# Patient Record
Sex: Male | Born: 1960 | Race: White | Hispanic: No | Marital: Married | Smoking: Never smoker
Health system: Southern US, Community
[De-identification: ages and names within clinical notes are randomized; demographics above are authoritative.]

## PROBLEM LIST (undated history)

## (undated) DIAGNOSIS — K760 Fatty (change of) liver, not elsewhere classified: Secondary | ICD-10-CM

## (undated) DIAGNOSIS — E785 Hyperlipidemia, unspecified: Secondary | ICD-10-CM

## (undated) DIAGNOSIS — J302 Other seasonal allergic rhinitis: Secondary | ICD-10-CM

## (undated) DIAGNOSIS — E291 Testicular hypofunction: Secondary | ICD-10-CM

## (undated) DIAGNOSIS — M069 Rheumatoid arthritis, unspecified: Secondary | ICD-10-CM

## (undated) DIAGNOSIS — L4050 Arthropathic psoriasis, unspecified: Secondary | ICD-10-CM

## (undated) DIAGNOSIS — E669 Obesity, unspecified: Secondary | ICD-10-CM

## (undated) DIAGNOSIS — I10 Essential (primary) hypertension: Secondary | ICD-10-CM

## (undated) DIAGNOSIS — J4530 Mild persistent asthma, uncomplicated: Secondary | ICD-10-CM

## (undated) DIAGNOSIS — K5790 Diverticulosis of intestine, part unspecified, without perforation or abscess without bleeding: Secondary | ICD-10-CM

## (undated) DIAGNOSIS — E119 Type 2 diabetes mellitus without complications: Secondary | ICD-10-CM

## (undated) DIAGNOSIS — J45909 Unspecified asthma, uncomplicated: Secondary | ICD-10-CM

## (undated) HISTORY — DX: Rheumatoid arthritis, unspecified: M06.9

## (undated) HISTORY — DX: Testicular hypofunction: E29.1

## (undated) HISTORY — DX: Diverticulosis of intestine, part unspecified, without perforation or abscess without bleeding: K57.90

## (undated) HISTORY — DX: Fatty (change of) liver, not elsewhere classified: K76.0

## (undated) HISTORY — DX: Hyperlipidemia, unspecified: E78.5

## (undated) HISTORY — DX: Essential (primary) hypertension: I10

## (undated) HISTORY — DX: Obesity, unspecified: E66.9

## (undated) HISTORY — DX: Mild persistent asthma, uncomplicated: J45.30

## (undated) HISTORY — PX: WISDOM TOOTH EXTRACTION: SHX21

## (undated) HISTORY — PX: VASECTOMY: SHX75

---

## 2008-08-09 ENCOUNTER — Encounter: Admission: RE | Admit: 2008-08-09 | Discharge: 2008-08-09 | Payer: Self-pay | Admitting: Otolaryngology

## 2008-09-08 ENCOUNTER — Encounter (INDEPENDENT_AMBULATORY_CARE_PROVIDER_SITE_OTHER): Payer: Self-pay | Admitting: Interventional Radiology

## 2008-09-08 ENCOUNTER — Other Ambulatory Visit: Admission: RE | Admit: 2008-09-08 | Discharge: 2008-09-08 | Payer: Self-pay | Admitting: Interventional Radiology

## 2008-09-08 ENCOUNTER — Encounter: Admission: RE | Admit: 2008-09-08 | Discharge: 2008-09-08 | Payer: Self-pay | Admitting: Otolaryngology

## 2012-05-23 ENCOUNTER — Other Ambulatory Visit (HOSPITAL_COMMUNITY): Payer: Self-pay | Admitting: Family Medicine

## 2012-05-23 ENCOUNTER — Ambulatory Visit (HOSPITAL_COMMUNITY)
Admission: RE | Admit: 2012-05-23 | Discharge: 2012-05-23 | Disposition: A | Discharge status: Home / Self Care | Payer: BC Managed Care – PPO | Source: Ambulatory Visit | Attending: Family Medicine | Admitting: Family Medicine

## 2012-05-23 DIAGNOSIS — M2550 Pain in unspecified joint: Secondary | ICD-10-CM

## 2012-05-23 DIAGNOSIS — M79609 Pain in unspecified limb: Secondary | ICD-10-CM | POA: Insufficient documentation

## 2014-01-26 ENCOUNTER — Other Ambulatory Visit (HOSPITAL_COMMUNITY): Payer: Self-pay | Admitting: Family Medicine

## 2014-01-26 DIAGNOSIS — R7989 Other specified abnormal findings of blood chemistry: Secondary | ICD-10-CM

## 2014-01-26 DIAGNOSIS — K769 Liver disease, unspecified: Secondary | ICD-10-CM

## 2014-01-26 DIAGNOSIS — R945 Abnormal results of liver function studies: Secondary | ICD-10-CM

## 2014-01-27 NOTE — H&P (Signed)
  NTS SOAP Note  Vital Signs:  Vitals as of: 01/27/2014: Systolic 147: Diastolic 88: Heart Rate 74: Temp 75F: Height 25ft 7in: Weight 195Lbs 0 Ounces: BMI 30.54  BMI : 30.54 kg/m2  Subjective: This 82 Years 59 Months old Male presents for screening TCS.  Never has had a colonoscopy.  No family h/o colon cancer.  Denies any gi complaints.  Review of Symptoms:  Constitutional:unremarkable   Head:unremarkable    Eyes:unremarkable   sinus problems Cardiovascular:  unremarkable   Respiratory:unremarkable   Gastrointestinal:  unremarkable   Genitourinary:unremarkable     Musculoskeletal:unremarkable   Skin:unremarkable Hematolgic/Lymphatic:unremarkable     Allergic/Immunologic:unremarkable     Past Medical History:    Reviewed  Past Medical History  Surgical History: vasectomy Medical Problems: copd Allergies: nkda Medications: folic acid, fenofibrate, advair, methotrexate, humara   Social History:Reviewed  Social History  Preferred Language: English Race:  White Ethnicity: Not Hispanic / Latino Age: 74 Years 5 Months Marital Status:  M Alcohol: no   Smoking Status: Never smoker reviewed on 01/21/2014 Functional Status reviewed on 01/21/2014 ------------------------------------------------ Bathing: Normal Cooking: Normal Dressing: Normal Driving: Normal Eating: Normal Managing Meds: Normal Oral Care: Normal Shopping: Normal Toileting: Normal Transferring: Normal Walking: Normal Cognitive Status reviewed on 01/21/2014 ------------------------------------------------ Attention: Normal Decision Making: Normal Language: Normal Memory: Normal Motor: Normal Perception: Normal Problem Solving: Normal Visual and Spatial: Normal   Family History:  Reviewed  Family Health History Mother, Living; Healthy; healthy Father, Deceased; Skin cancer;     Objective Information: General:  Well appearing, well  nourished in no distress. Heart:  RRR, no murmur Lungs:    CTA bilaterally, no wheezes, rhonchi, rales.  Breathing unlabored. Abdomen:Soft, NT/ND, no HSM, no masses.   deferred to procedure  Assessment:Need for screening TCS  Diagnoses: V76.51 Screening for malignant neoplasm of colon (Encounter for screening for malignant neoplasm of colon)  Procedures: 14239 - OFFICE OUTPATIENT NEW 20 MINUTES    Plan:  Scheduled for TCS on 02/23/14.   Patient Education:Alternative treatments to surgery were discussed with patient (and family).  Risks and benefits  of procedure were fully explained to the patient (and family) who gave informed consent. Patient/family questions were addressed.  Follow-up:Pending Surgery

## 2014-01-28 ENCOUNTER — Ambulatory Visit (HOSPITAL_COMMUNITY)
Admission: RE | Admit: 2014-01-28 | Discharge: 2014-01-28 | Disposition: A | Discharge status: Home / Self Care | Payer: BC Managed Care – PPO | Source: Ambulatory Visit | Attending: Family Medicine | Admitting: Family Medicine

## 2014-01-28 DIAGNOSIS — K7689 Other specified diseases of liver: Secondary | ICD-10-CM | POA: Insufficient documentation

## 2014-01-28 DIAGNOSIS — R945 Abnormal results of liver function studies: Secondary | ICD-10-CM

## 2014-01-28 DIAGNOSIS — R7989 Other specified abnormal findings of blood chemistry: Secondary | ICD-10-CM

## 2014-01-28 DIAGNOSIS — K769 Liver disease, unspecified: Secondary | ICD-10-CM

## 2014-02-16 ENCOUNTER — Encounter (HOSPITAL_COMMUNITY): Payer: Self-pay | Admitting: Pharmacy Technician

## 2014-02-23 ENCOUNTER — Ambulatory Visit (HOSPITAL_COMMUNITY)
Admission: RE | Admit: 2014-02-23 | Discharge: 2014-02-23 | Disposition: A | Discharge status: Home / Self Care | Payer: BC Managed Care – PPO | Source: Ambulatory Visit | Attending: General Surgery | Admitting: General Surgery

## 2014-02-23 ENCOUNTER — Encounter (HOSPITAL_COMMUNITY)
Admission: RE | Disposition: A | Discharge status: Home / Self Care | Payer: Self-pay | Source: Ambulatory Visit | Attending: General Surgery

## 2014-02-23 ENCOUNTER — Encounter (HOSPITAL_COMMUNITY): Payer: Self-pay | Admitting: *Deleted

## 2014-02-23 DIAGNOSIS — J449 Chronic obstructive pulmonary disease, unspecified: Secondary | ICD-10-CM | POA: Insufficient documentation

## 2014-02-23 DIAGNOSIS — Z1211 Encounter for screening for malignant neoplasm of colon: Secondary | ICD-10-CM | POA: Insufficient documentation

## 2014-02-23 DIAGNOSIS — K5730 Diverticulosis of large intestine without perforation or abscess without bleeding: Secondary | ICD-10-CM | POA: Insufficient documentation

## 2014-02-23 DIAGNOSIS — J4489 Other specified chronic obstructive pulmonary disease: Secondary | ICD-10-CM | POA: Insufficient documentation

## 2014-02-23 DIAGNOSIS — Z79899 Other long term (current) drug therapy: Secondary | ICD-10-CM | POA: Insufficient documentation

## 2014-02-23 HISTORY — PX: COLONOSCOPY: SHX5424

## 2014-02-23 HISTORY — DX: Other seasonal allergic rhinitis: J30.2

## 2014-02-23 HISTORY — DX: Unspecified asthma, uncomplicated: J45.909

## 2014-02-23 HISTORY — DX: Arthropathic psoriasis, unspecified: L40.50

## 2014-02-23 SURGERY — COLONOSCOPY
Anesthesia: Moderate Sedation

## 2014-02-23 MED ORDER — MIDAZOLAM HCL 5 MG/5ML IJ SOLN
INTRAMUSCULAR | Status: DC | PRN
  Administered 2014-02-23: 2 mg via INTRAVENOUS

## 2014-02-23 MED ORDER — SODIUM CHLORIDE 0.9 % IV SOLN
INTRAVENOUS | Status: DC
  Administered 2014-02-23: 08:00:00 via INTRAVENOUS

## 2014-02-23 MED ORDER — MIDAZOLAM HCL 5 MG/5ML IJ SOLN
INTRAMUSCULAR | Status: AC
  Filled 2014-02-23: qty 10

## 2014-02-23 MED ORDER — MEPERIDINE HCL 50 MG/ML IJ SOLN
INTRAMUSCULAR | Status: DC | PRN
  Administered 2014-02-23: 50 mg via INTRAVENOUS

## 2014-02-23 MED ORDER — MEPERIDINE HCL 100 MG/ML IJ SOLN
INTRAMUSCULAR | Status: AC
  Filled 2014-02-23: qty 2

## 2014-02-23 NOTE — Interval H&P Note (Signed)
History and Physical Interval Note:  02/23/2014 8:21 AM  Brandon Pennington  has presented today for surgery, with the diagnosis of screening  The various methods of treatment have been discussed with the patient and family. After consideration of risks, benefits and other options for treatment, the patient has consented to  Procedure(s): COLONOSCOPY (N/Pennington) as Pennington surgical intervention .  The patient's history has been reviewed, patient examined, no change in status, stable for surgery.  I have reviewed the patient's chart and labs.  Questions were answered to the patient's satisfaction.     Franky MachoJENKINS,Brandon Pennington

## 2014-02-23 NOTE — Op Note (Signed)
[facility] [address] [place], [id number]   COLONOSCOPY PROCEDURE REPORT  PATIENT: Brandon Pennington, Brandon L.  MR#: [id number] BIRTHDATE: 01/24/1961 , 52  yrs. old GENDER: Male ENDOSCOPIST: Franky MachoMark Feven Alderfer, MD REFERRED GE:XBMWUXLBY:Golding, John PROCEDURE DATE:  02/23/2014 PROCEDURE:   Colonoscopy, screening ASA CLASS:   Class II INDICATIONS:Average risk patient for colon cancer. MEDICATIONS: Versed 3 mg IV and Demerol 50 mg IV  DESCRIPTION OF PROCEDURE:   After the risks benefits and alternatives of the procedure were thoroughly explained, informed consent was obtained.  A digital rectal exam revealed no abnormalities of the rectum.   The [id number] ([id number]  endoscope was introduced through the anus and advanced to the cecum, which was identified by both the appendix and ileocecal valve. No adverse events experienced.   The quality of the prep was adequate, using Trilyte  The instrument was then slowly withdrawn as the colon was fully examined.      COLON FINDINGS: Mild diverticulosis was noted in the sigmoid colon. The colon mucosa was otherwise normal.  Retroflexed views revealed no abnormalities. The time to cecum=3 minutes 0 seconds. Withdrawal time=4 minutes 0 seconds.  The scope was withdrawn and the procedure completed. COMPLICATIONS: There were no complications.  ENDOSCOPIC IMPRESSION: 1.   Mild diverticulosis was noted in the sigmoid colon 2.   The colon mucosa was otherwise normal  RECOMMENDATIONS: Repeat Colonscopy in 10 years.   eSigned:  Franky MachoMark Landis Cassaro, MD 02/23/2014 8:45 AM   cc:

## 2014-02-23 NOTE — Discharge Instructions (Signed)

## 2014-02-24 ENCOUNTER — Encounter (HOSPITAL_COMMUNITY): Payer: Self-pay | Admitting: General Surgery

## 2014-12-04 ENCOUNTER — Emergency Department (HOSPITAL_COMMUNITY)
Admission: EM | Admit: 2014-12-04 | Discharge: 2014-12-05 | Disposition: A | Discharge status: Home / Self Care | Payer: BLUE CROSS/BLUE SHIELD | Attending: Emergency Medicine | Admitting: Emergency Medicine

## 2014-12-04 ENCOUNTER — Encounter (HOSPITAL_COMMUNITY): Payer: Self-pay | Admitting: Emergency Medicine

## 2014-12-04 DIAGNOSIS — Z7951 Long term (current) use of inhaled steroids: Secondary | ICD-10-CM | POA: Diagnosis not present

## 2014-12-04 DIAGNOSIS — K625 Hemorrhage of anus and rectum: Secondary | ICD-10-CM | POA: Insufficient documentation

## 2014-12-04 DIAGNOSIS — K5791 Diverticulosis of intestine, part unspecified, without perforation or abscess with bleeding: Secondary | ICD-10-CM

## 2014-12-04 DIAGNOSIS — K5790 Diverticulosis of intestine, part unspecified, without perforation or abscess without bleeding: Secondary | ICD-10-CM | POA: Diagnosis not present

## 2014-12-04 DIAGNOSIS — Z8739 Personal history of other diseases of the musculoskeletal system and connective tissue: Secondary | ICD-10-CM | POA: Insufficient documentation

## 2014-12-04 DIAGNOSIS — Z79899 Other long term (current) drug therapy: Secondary | ICD-10-CM | POA: Insufficient documentation

## 2014-12-04 DIAGNOSIS — R197 Diarrhea, unspecified: Secondary | ICD-10-CM | POA: Insufficient documentation

## 2014-12-04 DIAGNOSIS — J45909 Unspecified asthma, uncomplicated: Secondary | ICD-10-CM | POA: Insufficient documentation

## 2014-12-04 DIAGNOSIS — K921 Melena: Secondary | ICD-10-CM | POA: Diagnosis present

## 2014-12-04 LAB — CBC WITH DIFFERENTIAL/PLATELET
BASOS PCT: 1 % (ref 0–1)
Basophils Absolute: 0.1 10*3/uL (ref 0.0–0.1)
EOS ABS: 0.1 10*3/uL (ref 0.0–0.7)
Eosinophils Relative: 1 % (ref 0–5)
HEMATOCRIT: 45.3 % (ref 39.0–52.0)
HEMOGLOBIN: 15.3 g/dL (ref 13.0–17.0)
LYMPHS ABS: 1 10*3/uL (ref 0.7–4.0)
LYMPHS PCT: 12 % (ref 12–46)
MCH: 31 pg (ref 26.0–34.0)
MCHC: 33.8 g/dL (ref 30.0–36.0)
MCV: 91.7 fL (ref 78.0–100.0)
MONOS PCT: 17 % — AB (ref 3–12)
Monocytes Absolute: 1.5 10*3/uL — ABNORMAL HIGH (ref 0.1–1.0)
NEUTROS PCT: 69 % (ref 43–77)
Neutro Abs: 6.1 10*3/uL (ref 1.7–7.7)
PLATELETS: 291 10*3/uL (ref 150–400)
RBC: 4.94 MIL/uL (ref 4.22–5.81)
RDW: 13.9 % (ref 11.5–15.5)
WBC: 8.8 10*3/uL (ref 4.0–10.5)

## 2014-12-04 MED ORDER — SODIUM CHLORIDE 0.9 % IV BOLUS (SEPSIS)
1000.0000 mL | Freq: Once | INTRAVENOUS | Status: AC
Start: 2014-12-04 — End: 2014-12-05
  Administered 2014-12-04: 1000 mL via INTRAVENOUS

## 2014-12-04 NOTE — ED Provider Notes (Signed)
CSN: [id number]     Arrival date & time 12/04/14  2210 History  This chart was scribed for Zadie Rhineonald Randalyn Ahmed, MD by Jarvis Morganaylor Ferguson, ED Scribe. This patient was seen in room APA08/APA08 and the patient's care was started at 11:11 PM.    Chief Complaint  Patient presents with  . Blood In Stools    Patient is a 54 y.o. male presenting with GI illness. The history is provided by the patient. No language interpreter was used.  GI Problem This is a new problem. The current episode started 6 to 12 hours ago. The problem occurs rarely. The problem has been gradually worsening. Pertinent negatives include no chest pain, no abdominal pain, no headaches and no shortness of breath. Exacerbated by: bowel movements. Nothing relieves the symptoms. He has tried nothing for the symptoms.    HPI Comments: Brandon Pennington is a 54 y.o. male who presents to the Emergency Department complaining of episodic, gradually worsening blood in stools that began around 10 hours ago. Pt states he has had 6-7 episodes of bright red blood and diarrhea. He is complaining of associated subjective fever. He states he started a z-pak yesterday. Pt is not any anticoagulants. He denies h/o peptic ulcers. He dnies h/o HTN or DM. Pt has had a colonoscopy in the past that was normal. He denies melena, emesis, weakness, dizziness, chest pain, SOB, abdominal pain.    Past Medical History  Diagnosis Date  . Psoriatic arthritis   . Seasonal allergies   . Asthma    Past Surgical History  Procedure Laterality Date  . Wisdom tooth extraction    . Vasectomy    . Colonoscopy N/A 02/23/2014    Procedure: COLONOSCOPY;  Surgeon: Dalia HeadingMark A Jenkins, MD;  Location: [facility];  Service: Gastroenterology;  Laterality: N/A;   Family History  Problem Relation Age of Onset  . Melanoma Father   . Colon cancer Neg Hx    History  Substance Use Topics  . Smoking status: Never Smoker   . Smokeless tobacco: Not on file  . Alcohol Use: No     Review of Systems  Constitutional: Positive for fever (subjective).  Respiratory: Negative for shortness of breath.   Cardiovascular: Negative for chest pain.  Gastrointestinal: Positive for diarrhea and blood in stool. Negative for vomiting and abdominal pain.  Neurological: Negative for dizziness, weakness and headaches.  All other systems reviewed and are negative.     Allergies  Review of patient's allergies indicates no known allergies.  Home Medications   Prior to Admission medications   Medication Sig Start Date End Date Taking? Authorizing Provider  Adalimumab (HUMIRA) 40 MG/0.8ML PSKT Inject 40 mg into the skin every 14 (fourteen) days.    Historical Provider, MD  fenofibrate 160 MG tablet Take 160 mg by mouth daily.    Historical Provider, MD  Fluticasone-Salmeterol (ADVAIR) 250-50 MCG/DOSE AEPB Inhale 1 puff into the lungs 2 (two) times daily.    Historical Provider, MD  folic acid (FOLVITE) 1 MG tablet Take 1 mg by mouth daily.    Historical Provider, MD  methotrexate (50 MG/ML) 1 G injection Inject 25 mg into the vein once a week.    Historical Provider, MD  montelukast (SINGULAIR) 10 MG tablet Take 10 mg by mouth daily.    Historical Provider, MD  Multiple Vitamin (MULTIVITAMIN WITH MINERALS) TABS tablet Take 1 tablet by mouth daily.    Historical Provider, MD  testosterone cypionate (DEPOTESTOTERONE CYPIONATE) 200 MG/ML injection Inject 200  mg into the muscle every 28 (twenty-eight) days.    Historical Provider, MD   Triage Vitals: BP 127/83 mmHg  Pulse 120  Temp(Src) 99.6 F (37.6 C) (Oral)  Resp 20  Ht  (1.702 m)  Wt 195 lb (88.451 kg)  BMI 30.53 kg/m2  SpO2 98%   Physical Exam  Nursing note and vitals reviewed.  CONSTITUTIONAL: Well developed/well nourished HEAD: Normocephalic/atraumatic EYES: EOMI/PERRL ENMT: Mucous membranes moist NECK: supple no meningeal signs SPINE/BACK:entire spine nontender CV: S1/S2 noted, no murmurs/rubs/gallops  noted LUNGS: Lungs are clear to auscultation bilaterally, no apparent distress ABDOMEN: soft, nontender, no rebound or guarding, bowel sounds noted throughout abdomen GU:no cva tenderness. RECTAL: no hemorrhoids noted. Nurse chaperone present. Large amount of bloody stool noted in toilet NEURO: Pt is awake/alert/appropriate, moves all extremitiesx4.  No facial droop.   EXTREMITIES: pulses normal/equal, full ROM SKIN: warm, color normal PSYCH: no abnormalities of mood noted, alert and oriented to situation   ED Course  Procedures   DIAGNOSTIC STUDIES: Oxygen Saturation is 98% on RA, normal by my interpretation.    COORDINATION OF CARE: 11:17 PM- Will order NaCl bolus and diagnostic lab workup.  Pt advised of plan for treatment and pt agrees. 12:23 AM Per chart, patient had coloscopy in 2015 that showed diverticulosis Pt stable He is in no pain at this time 1:10 AM Pt improved He would prefer not to be admitted Suspect he has painless bleeding from diverticulosis He still would like to go home Plan is to monitor in ER, recheck CBC at 230am Vitals appropriate at this time 3:08 AM Pt improved No abd pain Vitals improved He did have some diarrhea here but improving He prefers to go home With painless rectal bleeding and known diverticulosis, suspect this is cause He understands this could get worse Reassured by minimal change in HGB while monitored in ED We discussed strict return precautions including abd pain, worsened bleeding/weakness/dizziness He will stop zpack Will defer further antibiotics for now   Labs Review Labs Reviewed  BASIC METABOLIC PANEL - Abnormal; Notable for the following:    Glucose, Bld 178 (*)    GFR calc non Af Amer 70 (*)    GFR calc Af Amer 81 (*)    All other components within normal limits  CBC WITH DIFFERENTIAL/PLATELET - Abnormal; Notable for the following:    Monocytes Relative 17 (*)    Monocytes Absolute 1.5 (*)    All other  components within normal limits  CBC  TYPE AND SCREEN      MDM   Final diagnoses:  Rectal bleeding  Diverticulosis of intestine with bleeding, unspecified intestinal tract location    Nursing notes including past medical history and social history reviewed and considered in documentation Labs/vital reviewed myself and considered during evaluation   I personally performed the services described in this documentation, which was scribed in my presence. The recorded information has been reviewed and is accurate.      Zadie Rhine, MD 12/05/14 [phone]

## 2014-12-04 NOTE — ED Notes (Signed)
Patient reports started taking Z-pack yesterday. Reports noticed bright red blood in his stool earlier today. States since has worsened and is now having bloody diarrhea. Denies pain or vomiting. Complains of slight nausea.

## 2014-12-05 LAB — CBC
HCT: 42.5 % (ref 39.0–52.0)
Hemoglobin: 14.7 g/dL (ref 13.0–17.0)
MCH: 31.6 pg (ref 26.0–34.0)
MCHC: 34.6 g/dL (ref 30.0–36.0)
MCV: 91.4 fL (ref 78.0–100.0)
Platelets: 277 10*3/uL (ref 150–400)
RBC: 4.65 MIL/uL (ref 4.22–5.81)
RDW: 13.9 % (ref 11.5–15.5)
WBC: 8.7 10*3/uL (ref 4.0–10.5)

## 2014-12-05 LAB — TYPE AND SCREEN
ABO/RH(D): O POS
Antibody Screen: NEGATIVE

## 2014-12-05 LAB — BASIC METABOLIC PANEL
Anion gap: 8 (ref 5–15)
BUN: 17 mg/dL (ref 6–23)
CALCIUM: 8.8 mg/dL (ref 8.4–10.5)
CHLORIDE: 103 mmol/L (ref 96–112)
CO2: 26 mmol/L (ref 19–32)
Creatinine, Ser: 1.17 mg/dL (ref 0.50–1.35)
GFR calc non Af Amer: 70 mL/min — ABNORMAL LOW (ref >90)
GFR, EST AFRICAN AMERICAN: 81 mL/min — AB (ref >90)
GLUCOSE: 178 mg/dL — AB (ref 70–99)
POTASSIUM: 3.9 mmol/L (ref 3.5–5.1)
Sodium: 137 mmol/L (ref 135–145)

## 2014-12-05 NOTE — Discharge Instructions (Signed)
Bloody Stools °Bloody stools means there is blood in your poop (stool). It is a sign that there is a problem somewhere in the digestive system. It is important for your doctor to find the cause of your bleeding, so the problem can be treated.  °HOME CARE °· Only take medicine as told by your doctor. °· Eat foods with fiber (prunes, bran cereals). °· Drink enough fluids to keep your pee (urine) clear or pale yellow. °· Sit in warm water (sitz bath) for 10 to 15 minutes as told by your doctor. °· Know how to take your medicines (enemas, suppositories) if advised by your doctor. °· Watch for signs that you are getting better or getting worse. °GET HELP RIGHT AWAY IF:  °· You are not getting better. °· You start to get better but then get worse again. °· You have new problems. °· You have severe bleeding from the place where poop comes out (rectum) that does not stop. °· You throw up (vomit) blood. °· You feel weak or pass out (faint). °· You have a fever. °MAKE SURE YOU:  °· Understand these instructions. °· Will watch your condition. °· Will get help right away if you are not doing well or get worse. °Document Released: 07/25/2009 Document Revised: 10/29/2011 Document Reviewed: 12/22/2010 °ExitCare® Patient Information ©2015 [employer]. This information is not intended to replace advice given to you by your health care provider. Make sure you discuss any questions you have with your health care provider. ° °

## 2015-12-06 IMAGING — US US ABDOMEN COMPLETE
1 series · 14 of 25 positions shown · non-contrast
Comparison: None.

CLINICAL DATA: Elevated LFTs.  No pain, nausea, vomiting.

EXAM:
ULTRASOUND ABDOMEN COMPLETE

[Series 1: us abdomen complete · 0.18mm/px · 14 of 150 slices shown]
[im 1/150]
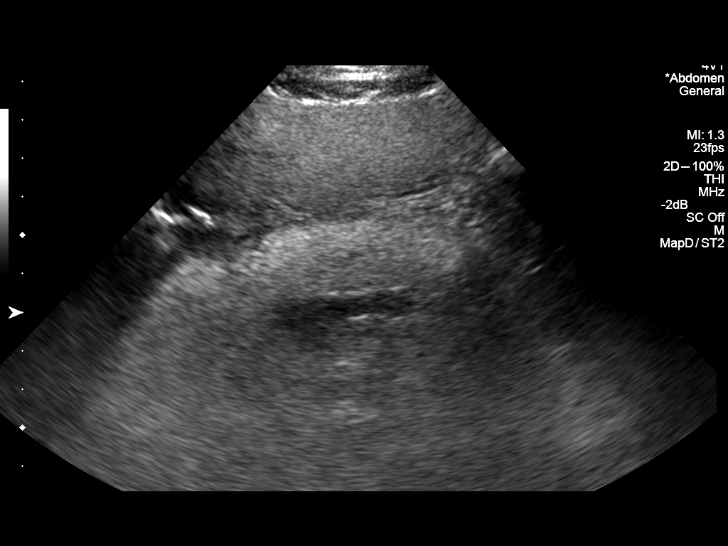
[im 13/150]
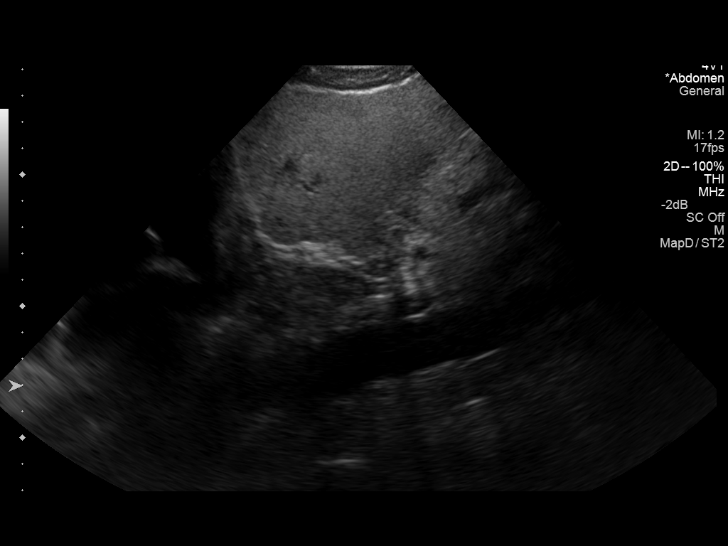
[im 25/150]
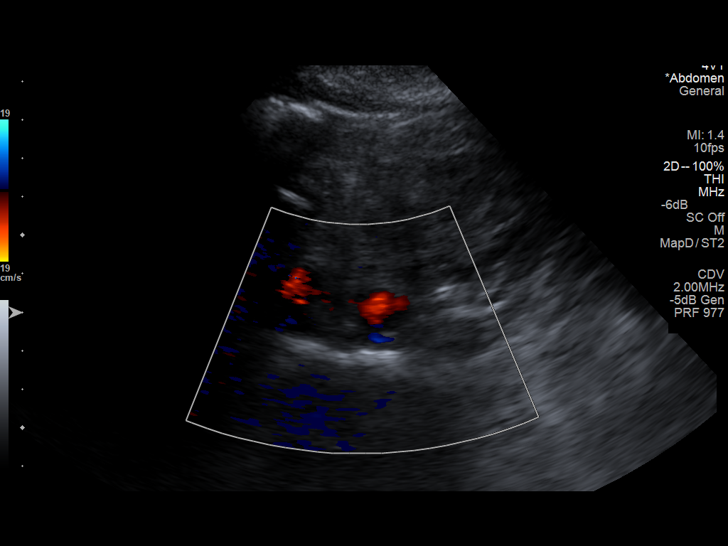
[im 38/150]
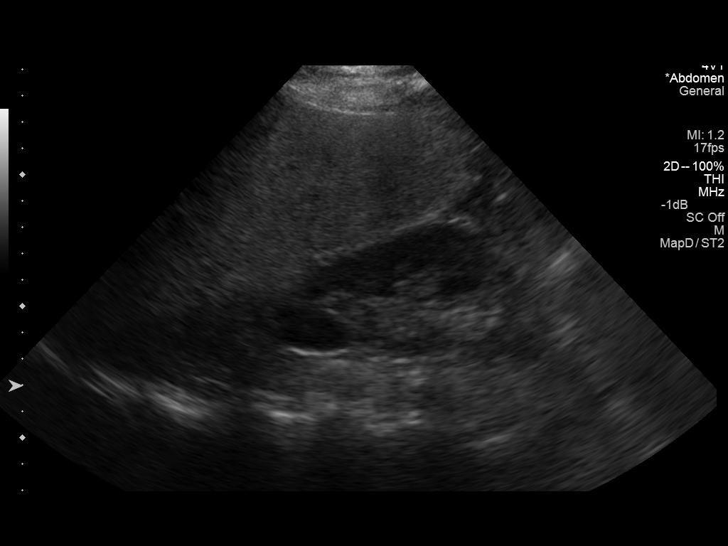
[im 50/150]
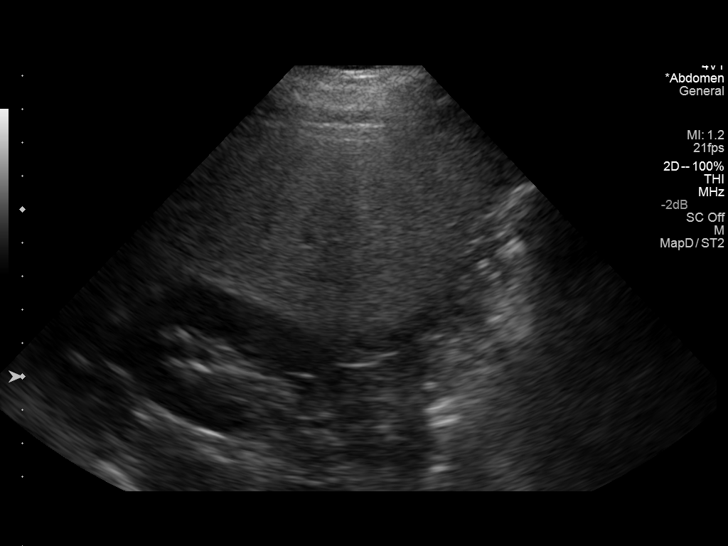
[im 56/150]
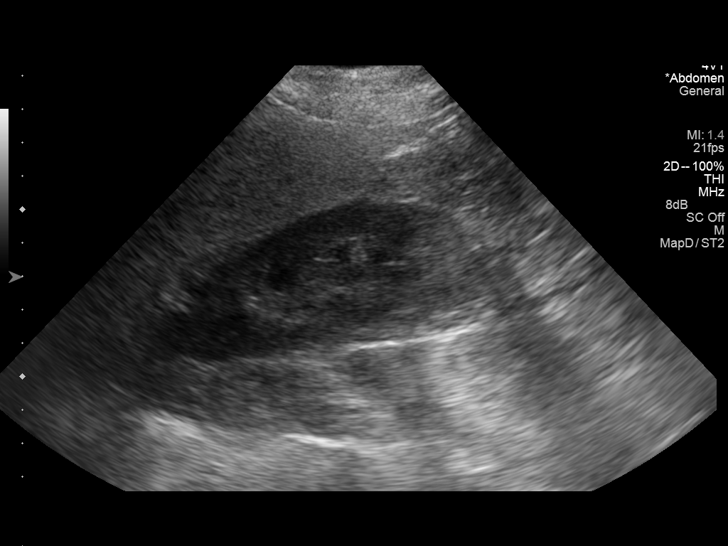
[im 69/150]
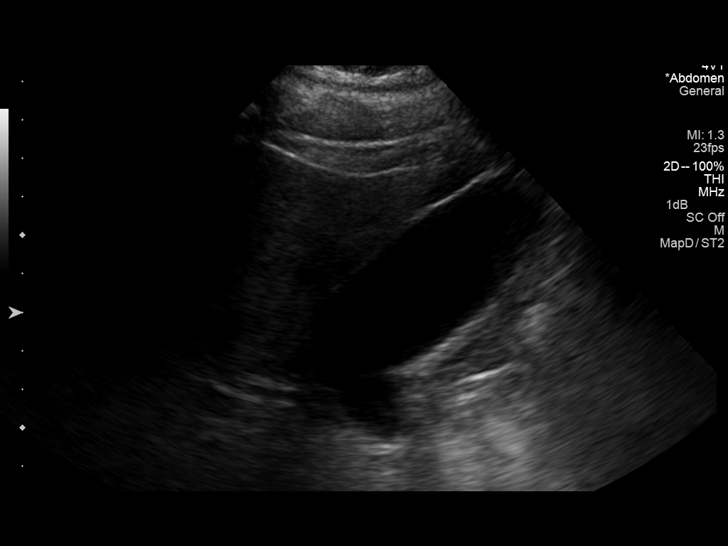
[im 81/150]
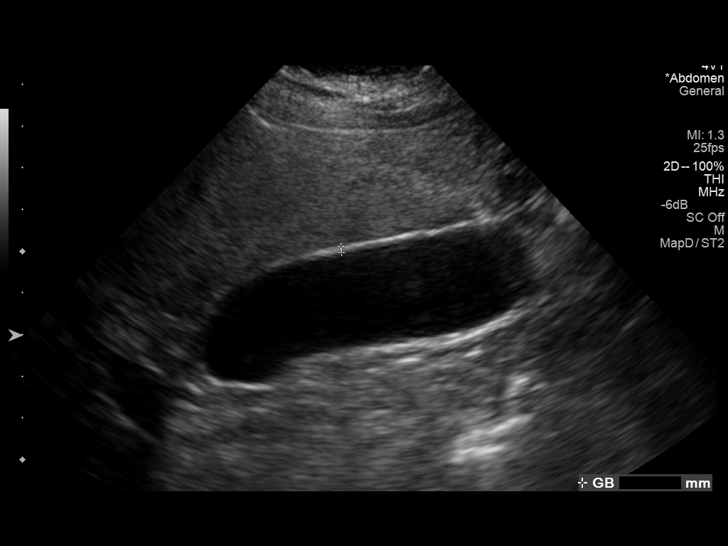
[im 94/150]
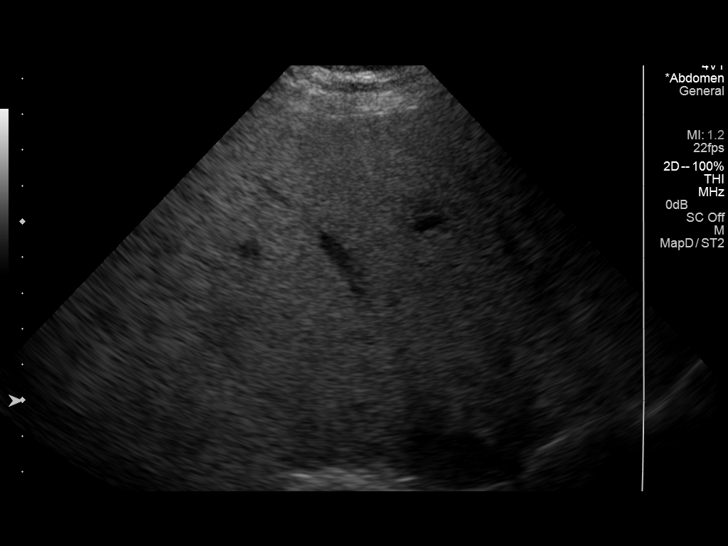
[im 100/150]
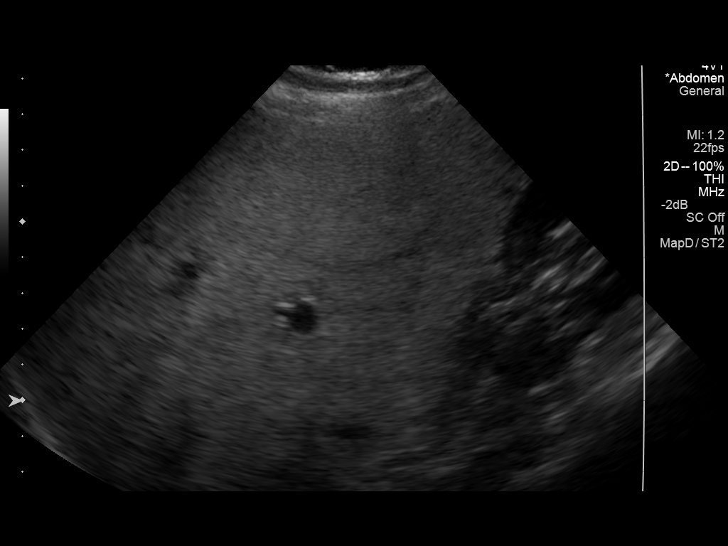
[im 112/150]
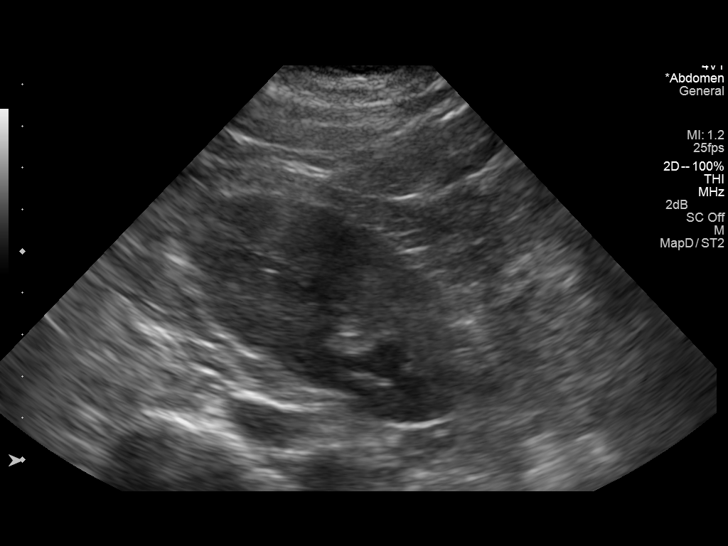
[im 125/150]
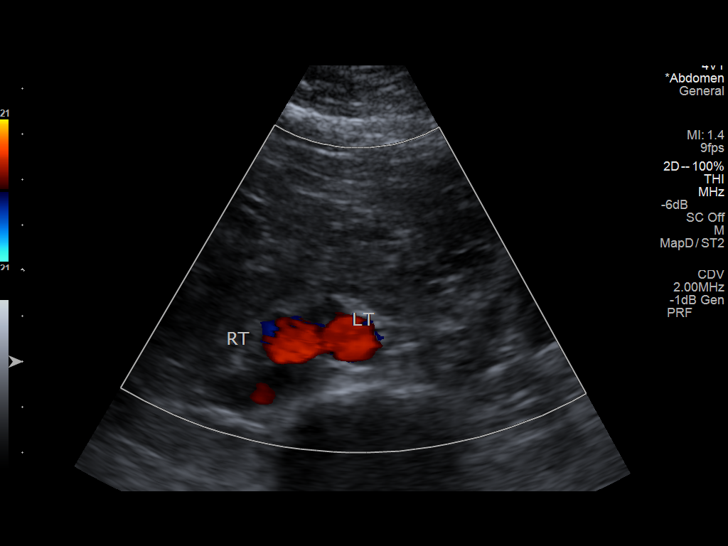
[im 137/150]
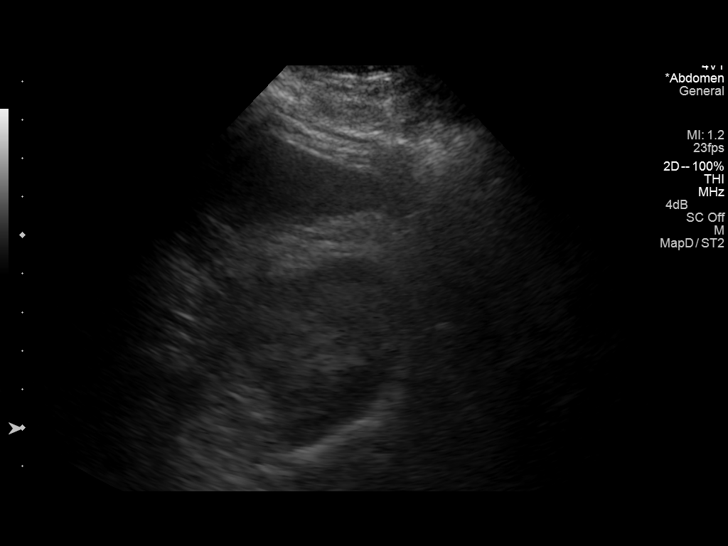
[im 150/150]
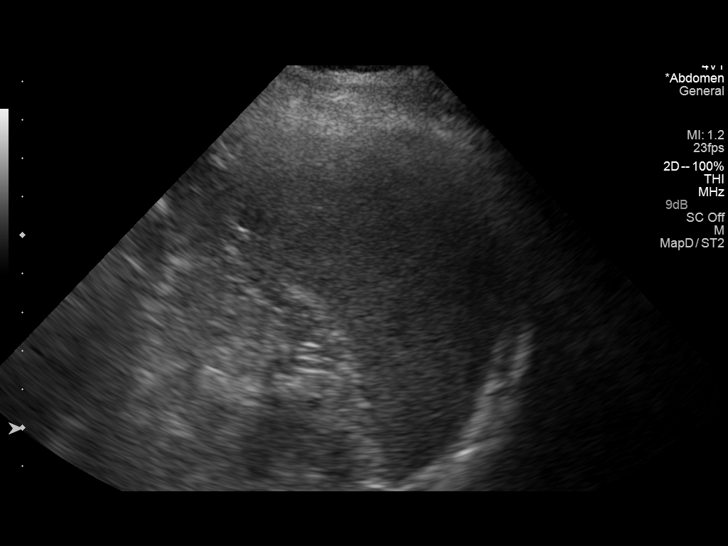

[14 of 25 positions shown; findings below may reference images not displayed]

FINDINGS: Gallbladder:

No gallstones or wall thickening visualized. No sonographic Murphy
sign noted.

Common bile duct:

Diameter: 3.5 mm.

Liver:

Mild increased echogenicity without focal mass.

IVC:

No abnormality visualized.

Pancreas:

Visualized portion unremarkable.

Spleen:

Size and appearance within normal limits.

Right Kidney:

Length: 12.1 cm. Echogenicity within normal limits. No mass or
hydronephrosis visualized.

Left Kidney:

Length: 14.2 cm. Echogenicity within normal limits. No mass or
hydronephrosis visualized. Echogenic focus over the lower pole which
shadowing measuring 1.2 cm likely a stone.

Abdominal aorta:

No aneurysm visualized.

Other findings:

None.
IMPRESSION: No acute hepatobiliary findings.

1.2 cm echogenic focus over the lower pole of the left kidney likely
a stone. No evidence of obstruction.

Mild hepatic steatosis.

## 2016-07-27 DIAGNOSIS — R7989 Other specified abnormal findings of blood chemistry: Secondary | ICD-10-CM | POA: Diagnosis not present

## 2016-07-27 DIAGNOSIS — R7309 Other abnormal glucose: Secondary | ICD-10-CM | POA: Diagnosis not present

## 2016-07-27 DIAGNOSIS — Z1389 Encounter for screening for other disorder: Secondary | ICD-10-CM | POA: Diagnosis not present

## 2016-07-27 DIAGNOSIS — M069 Rheumatoid arthritis, unspecified: Secondary | ICD-10-CM | POA: Diagnosis not present

## 2016-07-27 DIAGNOSIS — Z6829 Body mass index (BMI) 29.0-29.9, adult: Secondary | ICD-10-CM | POA: Diagnosis not present

## 2016-07-27 DIAGNOSIS — Z23 Encounter for immunization: Secondary | ICD-10-CM | POA: Diagnosis not present

## 2016-07-27 DIAGNOSIS — F321 Major depressive disorder, single episode, moderate: Secondary | ICD-10-CM | POA: Diagnosis not present

## 2016-07-27 DIAGNOSIS — E782 Mixed hyperlipidemia: Secondary | ICD-10-CM | POA: Diagnosis not present

## 2016-08-10 DIAGNOSIS — N200 Calculus of kidney: Secondary | ICD-10-CM | POA: Diagnosis not present

## 2016-08-10 DIAGNOSIS — E291 Testicular hypofunction: Secondary | ICD-10-CM | POA: Diagnosis not present

## 2016-08-10 DIAGNOSIS — R3129 Other microscopic hematuria: Secondary | ICD-10-CM | POA: Diagnosis not present

## 2016-10-25 DIAGNOSIS — M533 Sacrococcygeal disorders, not elsewhere classified: Secondary | ICD-10-CM | POA: Diagnosis not present

## 2016-10-25 DIAGNOSIS — M79643 Pain in unspecified hand: Secondary | ICD-10-CM | POA: Diagnosis not present

## 2016-10-25 DIAGNOSIS — M79642 Pain in left hand: Secondary | ICD-10-CM | POA: Diagnosis not present

## 2016-10-25 DIAGNOSIS — M79641 Pain in right hand: Secondary | ICD-10-CM | POA: Diagnosis not present

## 2016-10-25 DIAGNOSIS — M79673 Pain in unspecified foot: Secondary | ICD-10-CM | POA: Diagnosis not present

## 2016-10-25 DIAGNOSIS — M0609 Rheumatoid arthritis without rheumatoid factor, multiple sites: Secondary | ICD-10-CM | POA: Diagnosis not present

## 2016-10-25 DIAGNOSIS — M79671 Pain in right foot: Secondary | ICD-10-CM | POA: Diagnosis not present

## 2016-10-25 DIAGNOSIS — K760 Fatty (change of) liver, not elsewhere classified: Secondary | ICD-10-CM | POA: Diagnosis not present

## 2016-10-25 DIAGNOSIS — M79672 Pain in left foot: Secondary | ICD-10-CM | POA: Diagnosis not present

## 2016-12-10 DIAGNOSIS — R21 Rash and other nonspecific skin eruption: Secondary | ICD-10-CM | POA: Diagnosis not present

## 2016-12-10 DIAGNOSIS — M0609 Rheumatoid arthritis without rheumatoid factor, multiple sites: Secondary | ICD-10-CM | POA: Diagnosis not present

## 2016-12-10 DIAGNOSIS — M79643 Pain in unspecified hand: Secondary | ICD-10-CM | POA: Diagnosis not present

## 2016-12-10 DIAGNOSIS — K760 Fatty (change of) liver, not elsewhere classified: Secondary | ICD-10-CM | POA: Diagnosis not present

## 2017-02-21 DIAGNOSIS — M79643 Pain in unspecified hand: Secondary | ICD-10-CM | POA: Diagnosis not present

## 2017-02-21 DIAGNOSIS — R21 Rash and other nonspecific skin eruption: Secondary | ICD-10-CM | POA: Diagnosis not present

## 2017-02-21 DIAGNOSIS — M0509 Felty's syndrome, multiple sites: Secondary | ICD-10-CM | POA: Diagnosis not present

## 2017-02-21 DIAGNOSIS — K760 Fatty (change of) liver, not elsewhere classified: Secondary | ICD-10-CM | POA: Diagnosis not present

## 2017-02-21 DIAGNOSIS — M0609 Rheumatoid arthritis without rheumatoid factor, multiple sites: Secondary | ICD-10-CM | POA: Diagnosis not present

## 2017-05-31 DIAGNOSIS — E291 Testicular hypofunction: Secondary | ICD-10-CM | POA: Diagnosis not present

## 2017-05-31 DIAGNOSIS — N200 Calculus of kidney: Secondary | ICD-10-CM | POA: Diagnosis not present

## 2017-05-31 DIAGNOSIS — Z125 Encounter for screening for malignant neoplasm of prostate: Secondary | ICD-10-CM | POA: Diagnosis not present

## 2017-06-10 DIAGNOSIS — I10 Essential (primary) hypertension: Secondary | ICD-10-CM | POA: Diagnosis not present

## 2017-06-10 DIAGNOSIS — Z1389 Encounter for screening for other disorder: Secondary | ICD-10-CM | POA: Diagnosis not present

## 2017-06-10 DIAGNOSIS — Z6829 Body mass index (BMI) 29.0-29.9, adult: Secondary | ICD-10-CM | POA: Diagnosis not present

## 2017-06-10 DIAGNOSIS — E1165 Type 2 diabetes mellitus with hyperglycemia: Secondary | ICD-10-CM | POA: Diagnosis not present

## 2017-06-10 DIAGNOSIS — R718 Other abnormality of red blood cells: Secondary | ICD-10-CM | POA: Diagnosis not present

## 2017-06-10 DIAGNOSIS — E663 Overweight: Secondary | ICD-10-CM | POA: Diagnosis not present

## 2017-07-16 DIAGNOSIS — E291 Testicular hypofunction: Secondary | ICD-10-CM | POA: Diagnosis not present

## 2017-10-24 DIAGNOSIS — M0609 Rheumatoid arthritis without rheumatoid factor, multiple sites: Secondary | ICD-10-CM | POA: Diagnosis not present

## 2017-10-24 DIAGNOSIS — R21 Rash and other nonspecific skin eruption: Secondary | ICD-10-CM | POA: Diagnosis not present

## 2017-10-24 DIAGNOSIS — M79643 Pain in unspecified hand: Secondary | ICD-10-CM | POA: Diagnosis not present

## 2017-10-24 DIAGNOSIS — K760 Fatty (change of) liver, not elsewhere classified: Secondary | ICD-10-CM | POA: Diagnosis not present

## 2017-11-28 DIAGNOSIS — E291 Testicular hypofunction: Secondary | ICD-10-CM | POA: Diagnosis not present

## 2017-11-28 DIAGNOSIS — Z125 Encounter for screening for malignant neoplasm of prostate: Secondary | ICD-10-CM | POA: Diagnosis not present

## 2017-11-28 DIAGNOSIS — N200 Calculus of kidney: Secondary | ICD-10-CM | POA: Diagnosis not present

## 2017-12-11 DIAGNOSIS — M069 Rheumatoid arthritis, unspecified: Secondary | ICD-10-CM | POA: Diagnosis not present

## 2017-12-11 DIAGNOSIS — Z0001 Encounter for general adult medical examination with abnormal findings: Secondary | ICD-10-CM | POA: Diagnosis not present

## 2017-12-11 DIAGNOSIS — R7989 Other specified abnormal findings of blood chemistry: Secondary | ICD-10-CM | POA: Diagnosis not present

## 2017-12-11 DIAGNOSIS — R7309 Other abnormal glucose: Secondary | ICD-10-CM | POA: Diagnosis not present

## 2017-12-11 DIAGNOSIS — Z6830 Body mass index (BMI) 30.0-30.9, adult: Secondary | ICD-10-CM | POA: Diagnosis not present

## 2017-12-11 DIAGNOSIS — E1165 Type 2 diabetes mellitus with hyperglycemia: Secondary | ICD-10-CM | POA: Diagnosis not present

## 2017-12-11 DIAGNOSIS — Z1389 Encounter for screening for other disorder: Secondary | ICD-10-CM | POA: Diagnosis not present

## 2018-05-01 DIAGNOSIS — M79643 Pain in unspecified hand: Secondary | ICD-10-CM | POA: Diagnosis not present

## 2018-05-01 DIAGNOSIS — K760 Fatty (change of) liver, not elsewhere classified: Secondary | ICD-10-CM | POA: Diagnosis not present

## 2018-05-01 DIAGNOSIS — M0609 Rheumatoid arthritis without rheumatoid factor, multiple sites: Secondary | ICD-10-CM | POA: Diagnosis not present

## 2018-05-01 DIAGNOSIS — R21 Rash and other nonspecific skin eruption: Secondary | ICD-10-CM | POA: Diagnosis not present

## 2018-05-29 DIAGNOSIS — E6609 Other obesity due to excess calories: Secondary | ICD-10-CM | POA: Diagnosis not present

## 2018-05-29 DIAGNOSIS — Z1389 Encounter for screening for other disorder: Secondary | ICD-10-CM | POA: Diagnosis not present

## 2018-05-29 DIAGNOSIS — F329 Major depressive disorder, single episode, unspecified: Secondary | ICD-10-CM | POA: Diagnosis not present

## 2018-05-29 DIAGNOSIS — R7989 Other specified abnormal findings of blood chemistry: Secondary | ICD-10-CM | POA: Diagnosis not present

## 2018-05-29 DIAGNOSIS — Z6830 Body mass index (BMI) 30.0-30.9, adult: Secondary | ICD-10-CM | POA: Diagnosis not present

## 2018-05-29 DIAGNOSIS — E1165 Type 2 diabetes mellitus with hyperglycemia: Secondary | ICD-10-CM | POA: Diagnosis not present

## 2018-05-29 DIAGNOSIS — M069 Rheumatoid arthritis, unspecified: Secondary | ICD-10-CM | POA: Diagnosis not present

## 2023-06-10 NOTE — H&P (Signed)
Surgical History & Physical  Patient Name: Brandon Pennington  DOB: 04-04-1961  Surgery: Cataract extraction with intraocular lens implant phacoemulsification; Left Eye Surgeon: Pecolia Ades MD Surgery Date: 06/14/2023 Pre-Op Date: 06/04/2023  HPI: A 54 Yr. old male patient 1. The patient complains of difficulty when driving at night. The left eye is affected. The episode is constant. The condition's severity decreased since last visit. Distance vision is worse than near. The complaint is associated with blurry vision. HPI was performed by Pecolia Ades .  Medical History:  Diabetes High Blood Pressure Lung Problems  Review of Systems Negative Allergic/Immunologic Hypertensive Cardiovascular Negative Constitutional Negative Ear, Nose, Mouth & Throat Diabetes Endocrine Negative Eyes Negative Gastrointestinal Negative Genitourinary Negative Hemotologic/Lymphatic Negative Integumentary Negative Musculoskeletal Negative Neurological Negative Psychiatry Lung Problems Respiratory  Social Never Smoked  Medication glimepiride ,  Jardiance ,  Advair HFA ,  Singulair   Sx/Procedures Vasectomy  Drug Allergies  NKDA  History & Physical: Heent: cataracts NECK: supple without bruits LUNGS: lungs clear to auscultation CV: regular rate and rhythm Abdomen: soft and non-tender  Impression & Plan: Assessment: 1.  CATARACT AGE-RELATED NUCLEAR; Both Eyes (H25.13) 2.  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  Cataracts are visually significant and account for the patient's complaints. Discussed all risks, benefits, procedures and recovery, including infection, loss of vision and eye, need for glasses after surgery or additional procedures. Patient understands changing glasses will not improve vision. Patient indicated understanding of procedure. All questions answered. Patient desires to have surgery, recommend phacoemulsification with  intraocular lens. Patient to have preliminary testing necessary (Argos/IOL Master, Mac OCT, TOPO) Educational materials provided:Cataract. RECOMMEND STANDARD VS EYEHANCE VS TORIC VS VIVTY  Plan: - will need to return for repeat measurements - then can proceed with CE OS, followed by OD - DIB00 lens with best distance OU - ok with lying flat - no prior eye surgery, no fuchs  2.  Stressed importance of blood sugar and blood pressure control, and also yearly eye examinations. Discussed the need for ongoing proactive ocular exams and treatment, hopefully before visual symptoms develop. Diabetic correspondence sent to PCP/endocrinologist today and/or within the past year.  3.  Hold on glasses for now

## 2023-06-12 ENCOUNTER — Encounter (HOSPITAL_COMMUNITY)
Admission: RE | Admit: 2023-06-12 | Discharge: 2023-06-12 | Disposition: A | Discharge status: Home / Self Care | Payer: Commercial Managed Care - PPO | Source: Ambulatory Visit | Attending: Optometry | Admitting: Optometry

## 2023-06-12 NOTE — Progress Notes (Signed)
Second attempt to contact patient for the pre-op phone call; no answer

## 2023-06-12 NOTE — Progress Notes (Signed)
Attempted to complete pre-op phone call; no answer; left message

## 2023-06-13 NOTE — Progress Notes (Signed)
Attempted to contact patient for pre-op phone call; no answer and message left on voicemail.

## 2023-06-13 NOTE — Progress Notes (Signed)
Attempted to call patient this morning for pre-op instructions; no answer.

## 2023-06-14 ENCOUNTER — Ambulatory Visit (HOSPITAL_COMMUNITY): Payer: Commercial Managed Care - PPO | Admitting: Anesthesiology

## 2023-06-14 ENCOUNTER — Encounter (HOSPITAL_COMMUNITY)
Admission: RE | Disposition: A | Discharge status: Home / Self Care | Payer: Self-pay | Source: Home / Self Care | Attending: Optometry

## 2023-06-14 ENCOUNTER — Ambulatory Visit (HOSPITAL_COMMUNITY)
Admission: RE | Admit: 2023-06-14 | Discharge: 2023-06-14 | Disposition: A | Discharge status: Home / Self Care | Payer: Commercial Managed Care - PPO | Attending: Optometry | Admitting: Optometry

## 2023-06-14 ENCOUNTER — Encounter (HOSPITAL_COMMUNITY): Payer: Self-pay | Admitting: Optometry

## 2023-06-14 DIAGNOSIS — E1136 Type 2 diabetes mellitus with diabetic cataract: Secondary | ICD-10-CM | POA: Insufficient documentation

## 2023-06-14 DIAGNOSIS — H2512 Age-related nuclear cataract, left eye: Secondary | ICD-10-CM | POA: Insufficient documentation

## 2023-06-14 DIAGNOSIS — H5203 Hypermetropia, bilateral: Secondary | ICD-10-CM | POA: Insufficient documentation

## 2023-06-14 DIAGNOSIS — Z7984 Long term (current) use of oral hypoglycemic drugs: Secondary | ICD-10-CM | POA: Insufficient documentation

## 2023-06-14 DIAGNOSIS — J45909 Unspecified asthma, uncomplicated: Secondary | ICD-10-CM | POA: Insufficient documentation

## 2023-06-14 DIAGNOSIS — H269 Unspecified cataract: Secondary | ICD-10-CM

## 2023-06-14 HISTORY — DX: Type 2 diabetes mellitus without complications: E11.9

## 2023-06-14 HISTORY — PX: CATARACT EXTRACTION W/PHACO: SHX586

## 2023-06-14 LAB — GLUCOSE, CAPILLARY: Glucose-Capillary: 135 mg/dL — ABNORMAL HIGH (ref 70–99)

## 2023-06-14 SURGERY — PHACOEMULSIFICATION, CATARACT, WITH IOL INSERTION
Anesthesia: Monitor Anesthesia Care | Site: Eye | Laterality: Left

## 2023-06-14 MED ORDER — LIDOCAINE HCL 3.5 % OP GEL
1.0000 | Freq: Once | OPHTHALMIC | Status: AC
  Administered 2023-06-14: 1 via OPHTHALMIC

## 2023-06-14 MED ORDER — STERILE WATER FOR IRRIGATION IR SOLN
Status: DC | PRN
  Administered 2023-06-14: 250 mL

## 2023-06-14 MED ORDER — BSS IO SOLN
INTRAOCULAR | Status: DC | PRN
  Administered 2023-06-14: 15 mL via INTRAOCULAR

## 2023-06-14 MED ORDER — SODIUM CHLORIDE 0.9% FLUSH
INTRAVENOUS | Status: DC | PRN
  Administered 2023-06-14 (×2): 5 mL via INTRAVENOUS

## 2023-06-14 MED ORDER — MIDAZOLAM HCL 2 MG/2ML IJ SOLN
INTRAMUSCULAR | Status: DC | PRN
  Administered 2023-06-14: 2 mg via INTRAVENOUS

## 2023-06-14 MED ORDER — TETRACAINE HCL 0.5 % OP SOLN
1.0000 [drp] | OPHTHALMIC | Status: AC | PRN
  Administered 2023-06-14 (×3): 1 [drp] via OPHTHALMIC

## 2023-06-14 MED ORDER — MIDAZOLAM HCL 2 MG/2ML IJ SOLN
INTRAMUSCULAR | Status: AC
  Filled 2023-06-14: qty 2

## 2023-06-14 MED ORDER — MOXIFLOXACIN HCL 5 MG/ML IO SOLN
INTRAOCULAR | Status: DC | PRN
  Administered 2023-06-14: .2 mL via INTRACAMERAL

## 2023-06-14 MED ORDER — FENTANYL CITRATE (PF) 100 MCG/2ML IJ SOLN
INTRAMUSCULAR | Status: DC | PRN
  Administered 2023-06-14: 50 ug via INTRAVENOUS

## 2023-06-14 MED ORDER — LIDOCAINE HCL (PF) 1 % IJ SOLN
INTRAMUSCULAR | Status: DC | PRN
  Administered 2023-06-14: 2 mL

## 2023-06-14 MED ORDER — PHENYLEPHRINE-KETOROLAC 1-0.3 % IO SOLN
INTRAOCULAR | Status: DC | PRN
  Administered 2023-06-14: 500 mL via OPHTHALMIC

## 2023-06-14 MED ORDER — PHENYLEPHRINE HCL 2.5 % OP SOLN
1.0000 [drp] | OPHTHALMIC | Status: AC | PRN
  Administered 2023-06-14 (×3): 1 [drp] via OPHTHALMIC

## 2023-06-14 MED ORDER — POVIDONE-IODINE 5 % OP SOLN
OPHTHALMIC | Status: DC | PRN
  Administered 2023-06-14: 1 via OPHTHALMIC

## 2023-06-14 MED ORDER — TROPICAMIDE 1 % OP SOLN
1.0000 [drp] | OPHTHALMIC | Status: AC | PRN
  Administered 2023-06-14 (×3): 1 [drp] via OPHTHALMIC

## 2023-06-14 MED ORDER — PHENYLEPHRINE-KETOROLAC 1-0.3 % IO SOLN
INTRAOCULAR | Status: AC
  Filled 2023-06-14: qty 4

## 2023-06-14 MED ORDER — FENTANYL CITRATE (PF) 100 MCG/2ML IJ SOLN
INTRAMUSCULAR | Status: AC
  Filled 2023-06-14: qty 2

## 2023-06-14 MED ORDER — SIGHTPATH DOSE#1 NA HYALUR & NA CHOND-NA HYALUR IO KIT
PACK | INTRAOCULAR | Status: DC | PRN
  Administered 2023-06-14: 1 via OPHTHALMIC

## 2023-06-14 SURGICAL SUPPLY — 15 items
CATARACT SUITE SIGHTPATH (MISCELLANEOUS) ×1
CLOTH BEACON ORANGE TIMEOUT ST (SAFETY) ×1 IMPLANT
DRSG TEGADERM 4X4.75 (GAUZE/BANDAGES/DRESSINGS) ×1 IMPLANT
EYE SHIELD UNIVERSAL CLEAR (GAUZE/BANDAGES/DRESSINGS) IMPLANT
FEE CATARACT SUITE SIGHTPATH (MISCELLANEOUS) ×1 IMPLANT
GLOVE BIOGEL PI IND STRL 7.0 (GLOVE) ×2 IMPLANT
LENS IOL TECNIS EYHANCE 20.5 (Intraocular Lens) IMPLANT
NDL HYPO 18GX1.5 BLUNT FILL (NEEDLE) ×1 IMPLANT
NEEDLE HYPO 18GX1.5 BLUNT FILL (NEEDLE) ×1
PAD ARMBOARD 7.5X6 YLW CONV (MISCELLANEOUS) ×1 IMPLANT
POSITIONER HEAD 8X9X4 ADT (SOFTGOODS) ×1 IMPLANT
RING MALYGIN 7.0 (MISCELLANEOUS) IMPLANT
SYR TB 1ML LL NO SAFETY (SYRINGE) ×1 IMPLANT
TAPE SURG TRANSPORE 1 IN (GAUZE/BANDAGES/DRESSINGS) IMPLANT
WATER STERILE IRR 250ML POUR (IV SOLUTION) ×1 IMPLANT

## 2023-06-14 NOTE — Op Note (Signed)
Date of procedure: 06/14/23  Pre-operative diagnosis: Visually significant age-related nuclear cataract, Left Eye (H25.12)  Post-operative diagnosis: Visually significant age-related nuclear cataract, Left Eye  Procedure: Removal of cataract via phacoemulsification and insertion of intra-ocular lens J&J DIB00 +14.0D into the capsular bag of the Left Eye  Attending surgeon: Ronal Fear, MD  Anesthesia: MAC, Topical Akten  Complications: None  Estimated Blood Loss: <45mL (minimal)  Specimens: None  Implants:  Implant Name Type Inv. Item Serial No. Manufacturer Lot No. LRB No. Used Action  LENS IOL TECNIS EYHANCE 20.5 - [id number] Intraocular Lens LENS IOL TECNIS EYHANCE 20.5 [id number] [employer]  Left 1 Implanted    Indications:  Visually significant age-related cataract, Left Eye  Procedure:  The patient was seen and identified in the pre-operative area. The operative eye was identified and dilated.  The operative eye was marked.  Topical anesthesia was administered to the operative eye.     The patient was then to the operative suite and placed in the supine position.  A timeout was performed confirming the patient, procedure to be performed, and all other relevant information.   The patient's face was prepped and draped in the usual fashion for intra-ocular surgery.  A lid speculum was placed into the operative eye and the surgical microscope moved into place and focused.  An inferotemporal paracentesis was created using a 20 gauge paracentesis blade.  BSS mixed with Omidria, followed by 1% lidocaine was injected into the anterior chamber.  Viscoelastic was injected into the anterior chamber.  A temporal clear-corneal main wound incision was created using a 2.29mm microkeratome.  A continuous curvilinear capsulorrhexis was initiated using an irrigating cystitome and completed using capsulorrhexis forceps.  Hydrodissection and hydrodeliniation were performed.  Viscoelastic was  injected into the anterior chamber.  A phacoemulsification handpiece and a chopper as a second instrument were used to remove the nucleus and epinucleus. The irrigation/aspiration handpiece was used to remove any remaining cortical material.   The capsular bag was reinflated with viscoelastic, checked, and found to be intact.  The intraocular lens was inserted into the capsular bag.  The irrigation/aspiration handpiece was used to remove any remaining viscoelastic.  The clear corneal wound and paracentesis wounds were then hydrated and checked with Weck-Cels to be watertight. Moxifloxacin was instilled into the anterior chamber.  The lid-speculum and drape was removed, and the patient's face was cleaned with a wet and dry 4x4.  A clear shield was taped over the eye. The patient was taken to the post-operative care unit in good condition, having tolerated the procedure well.  Post-Op Instructions: The patient will follow up at [facility] for a same day post-operative evaluation and will receive all other orders and instructions.

## 2023-06-14 NOTE — Anesthesia Postprocedure Evaluation (Signed)
Anesthesia Post Note  Patient: Brandon Pennington  Procedure(s) Performed: CATARACT EXTRACTION PHACO AND INTRAOCULAR LENS PLACEMENT (IOC) (Left: Eye)  Patient location during evaluation: PACU Anesthesia Type: MAC Level of consciousness: awake and alert Pain management: pain level controlled Vital Signs Assessment: post-procedure vital signs reviewed and stable Respiratory status: spontaneous breathing, nonlabored ventilation, respiratory function stable and patient connected to nasal cannula oxygen Cardiovascular status: stable and blood pressure returned to baseline Postop Assessment: no apparent nausea or vomiting Anesthetic complications: no   There were no known notable events for this encounter.   Last Vitals:  Vitals:   06/14/23 1246 06/14/23 1406  BP: (!) 169/87 130/85  Pulse: 72 77  Resp: 18 17  Temp: 36.6 C 36.7 C  SpO2: 99% 97%    Last Pain:  Vitals:   06/14/23 1406  TempSrc: Oral  PainSc: 0-No pain                 La Shehan L Tawona Filsinger

## 2023-06-14 NOTE — Transfer of Care (Signed)
Immediate Anesthesia Transfer of Care Note  Patient: Brandon Pennington  Procedure(s) Performed: CATARACT EXTRACTION PHACO AND INTRAOCULAR LENS PLACEMENT (IOC) (Left: Eye)  Patient Location: Short Stay  Anesthesia Type:MAC  Level of Consciousness: Awake. Patient cooperative.  Airway & Oxygen Therapy: Patient Spontanous Breathing  Post-op Assessment: Report given to RN and Post -op Vital signs reviewed and stable  Post vital signs: Reviewed and stable  Last Vitals:  Vitals Value Taken Time  BP 130/85 06/14/23 1406  Temp 36.7 C 06/14/23 1406  Pulse 77 06/14/23 1406  Resp 17 06/14/23 1406  SpO2 97 % 06/14/23 1406    Last Pain:  Vitals:   06/14/23 1406  TempSrc: Oral  PainSc: 0-No pain      Patients Stated Pain Goal: 6 (06/14/23 1246)  Complications: No notable events documented.

## 2023-06-14 NOTE — Discharge Instructions (Signed)
Please discharge patient when stable, will follow up today with Dr. Tod Abrahamsen at the [facility] [place] office immediately following discharge.  Leave shield in place until visit.  All paperwork with discharge instructions will be given at the office.  [facility] Address:  [address]  [place], [place] [address]  Dr. Feliciano Wynter's Phone: [phone]  

## 2023-06-14 NOTE — Interval H&P Note (Signed)
History and Physical Interval Note:  06/14/2023 1:06 PM  The H and P was reviewed and updated. The patient was examined.  No changes were found after exam.  The surgical eye was marked.  Deandre Stansel

## 2023-06-14 NOTE — Anesthesia Preprocedure Evaluation (Signed)
Anesthesia Evaluation  Patient identified by MRN, date of birth, ID band Patient awake    Reviewed: Allergy & Precautions, H&P , NPO status , Patient's Chart, lab work & pertinent test results, reviewed documented beta blocker date and time   Airway Mallampati: II  TM Distance: >3 FB Neck ROM: full    Dental no notable dental hx. (+) Dental Advisory Given, Teeth Intact   Pulmonary asthma    Pulmonary exam normal breath sounds clear to auscultation       Cardiovascular Exercise Tolerance: Good negative cardio ROS Normal cardiovascular exam Rhythm:regular Rate:Normal     Neuro/Psych negative neurological ROS  negative psych ROS   GI/Hepatic negative GI ROS, Neg liver ROS,,,  Endo/Other  negative endocrine ROS    Renal/GU negative Renal ROS  negative genitourinary   Musculoskeletal   Abdominal Normal abdominal exam  (+)   Peds  Hematology negative hematology ROS (+)   Anesthesia Other Findings Psoriatic arthritis  Reproductive/Obstetrics negative OB ROS                             Anesthesia Physical Anesthesia Plan  ASA: 2  Anesthesia Plan: MAC   Post-op Pain Management: Minimal or no pain anticipated   Induction:   PONV Risk Score and Plan:   Airway Management Planned: Nasal Cannula and Natural Airway  Additional Equipment: None  Intra-op Plan:   Post-operative Plan:   Informed Consent: I have reviewed the patients History and Physical, chart, labs and discussed the procedure including the risks, benefits and alternatives for the proposed anesthesia with the patient or authorized representative who has indicated his/her understanding and acceptance.     Dental Advisory Given  Plan Discussed with: CRNA  Anesthesia Plan Comments:        Anesthesia Quick Evaluation

## 2023-06-19 ENCOUNTER — Encounter (HOSPITAL_COMMUNITY): Payer: Self-pay | Admitting: Optometry

## 2023-07-09 ENCOUNTER — Encounter (HOSPITAL_COMMUNITY)
Admission: RE | Admit: 2023-07-09 | Discharge: 2023-07-09 | Disposition: A | Discharge status: Home / Self Care | Payer: Commercial Managed Care - PPO | Source: Ambulatory Visit | Attending: Optometry | Admitting: Optometry

## 2023-07-09 NOTE — Pre-Procedure Instructions (Signed)
Attempted pre-op phone call. Left VM for him to call us back. 

## 2023-07-10 NOTE — H&P (Signed)
Surgical History & Physical  Patient Name: Brandon Pennington  DOB: 1961-07-28  Surgery: Cataract extraction with intraocular lens implant phacoemulsification; Right Eye Surgeon: Pecolia Ades MD Surgery Date: 07/12/2023 Pre-Op Date: 06/25/2023  HPI: A 69 Yr. old male patient present for 11 day post op OS. Vision is still blurry at a distance when looking at smaller street signs. Using combo gtt BID OS. Patient is not scheduled for OD.  Medical History:  Diabetes High Blood Pressure Lung Problems  Review of Systems Negative Allergic/Immunologic HTN Cardiovascular Negative Constitutional Negative Ear, Nose, Mouth & Throat Diabetes Endocrine Negative Eyes Negative Gastrointestinal Negative Genitourinary Negative Hemotologic/Lymphatic Negative Integumentary Negative Musculoskeletal Negative Neurological Negative Psychiatry Lung problems Respiratory  Social Never Smoked  Medication Prednisolone-moxiflox-bromfen,  glimepiride ,  Jardiance ,  Advair HFA ,  Singulair   Sx/Procedures Phaco c IOL OS,  Vasectomy  Drug Allergies  NKDA  History & Physical: Heent:cataract NECK: supple without bruits LUNGS: lungs clear to auscultation CV: regular rate and rhythm Abdomen: soft and non-tender  Impression & Plan: Assessment: 1.  CATARACT EXTRACTION STATUS; Left Eye (Z98.42) 2.  INTRAOCULAR LENS IOL (Z96.1) 3.  CATARACT AGE-RELATED NUCLEAR; Both Eyes (H25.13)  Plan: 1.  POD#11 Continue with eye drops as directed. Reviewed all post-op precautions. Call with increased redness, swelling, pain or loss of vision. Avoid swimming pools and hot tubs for 1 week.  2.  Doing well since surgery  3.  Cataracts are visually significant and account for the patient's complaints. Discussed all risks, benefits, procedures and recovery, including infection, loss of vision and eye, need for glasses after surgery or additional procedures. Patient understands changing glasses will not improve  vision. Patient indicated understanding of procedure. All questions answered. Patient desires to have surgery, recommend phacoemulsification with intraocular lens. Patient to have preliminary testing necessary ([employer]/IOL Master, Mac OCT, TOPO) Educational materials provided:Cataract. RECOMMEND STANDARD VS EYEHANCE VS TORIC VS VIVTY  Plan: - Proceed with CE OD when ready - DIB00 lens with best distance OU (target first plus lens) - ok with lying flat - no prior eye surgery, no fuchs

## 2023-07-12 ENCOUNTER — Encounter (HOSPITAL_COMMUNITY)
Admission: RE | Disposition: A | Discharge status: Home / Self Care | Payer: Self-pay | Source: Ambulatory Visit | Attending: Optometry

## 2023-07-12 ENCOUNTER — Ambulatory Visit (HOSPITAL_COMMUNITY)
Admission: RE | Admit: 2023-07-12 | Discharge: 2023-07-12 | Disposition: A | Discharge status: Home / Self Care | Payer: Commercial Managed Care - PPO | Source: Ambulatory Visit | Attending: Optometry | Admitting: Optometry

## 2023-07-12 ENCOUNTER — Ambulatory Visit (HOSPITAL_COMMUNITY): Payer: Commercial Managed Care - PPO | Admitting: Anesthesiology

## 2023-07-12 ENCOUNTER — Encounter (HOSPITAL_COMMUNITY): Payer: Self-pay | Admitting: Optometry

## 2023-07-12 DIAGNOSIS — H2511 Age-related nuclear cataract, right eye: Secondary | ICD-10-CM

## 2023-07-12 DIAGNOSIS — Z7951 Long term (current) use of inhaled steroids: Secondary | ICD-10-CM | POA: Diagnosis not present

## 2023-07-12 DIAGNOSIS — Z7984 Long term (current) use of oral hypoglycemic drugs: Secondary | ICD-10-CM | POA: Insufficient documentation

## 2023-07-12 DIAGNOSIS — E1136 Type 2 diabetes mellitus with diabetic cataract: Secondary | ICD-10-CM | POA: Insufficient documentation

## 2023-07-12 DIAGNOSIS — J45909 Unspecified asthma, uncomplicated: Secondary | ICD-10-CM | POA: Insufficient documentation

## 2023-07-12 HISTORY — PX: ANTERIOR VITRECTOMY: SHX1173

## 2023-07-12 HISTORY — PX: CATARACT EXTRACTION W/PHACO: SHX586

## 2023-07-12 LAB — GLUCOSE, CAPILLARY: Glucose-Capillary: 115 mg/dL — ABNORMAL HIGH (ref 70–99)

## 2023-07-12 SURGERY — PHACOEMULSIFICATION, CATARACT, WITH IOL INSERTION
Anesthesia: Monitor Anesthesia Care | Site: Eye | Laterality: Right

## 2023-07-12 MED ORDER — LACTATED RINGERS IV SOLN: INTRAVENOUS | Status: DC

## 2023-07-12 MED ORDER — FENTANYL CITRATE (PF) 100 MCG/2ML IJ SOLN
INTRAMUSCULAR | Status: DC | PRN
Start: 2023-07-12 — End: 2023-07-12
  Administered 2023-07-12 (×2): 50 ug via INTRAVENOUS

## 2023-07-12 MED ORDER — MOXIFLOXACIN HCL 5 MG/ML IO SOLN
INTRAOCULAR | Status: DC | PRN
  Administered 2023-07-12: .3 mL via INTRACAMERAL

## 2023-07-12 MED ORDER — ACETAZOLAMIDE 250 MG PO TABS
500.0000 mg | ORAL_TABLET | Freq: Once | ORAL | Status: AC
Start: 2023-07-12 — End: 2023-07-12
  Administered 2023-07-12: 500 mg via ORAL
  Filled 2023-07-12: qty 2

## 2023-07-12 MED ORDER — MIDAZOLAM HCL 2 MG/2ML IJ SOLN
INTRAMUSCULAR | Status: DC | PRN
  Administered 2023-07-12: 2 mg via INTRAVENOUS

## 2023-07-12 MED ORDER — BSS IO SOLN
INTRAOCULAR | Status: DC | PRN
  Administered 2023-07-12: 15 mL via INTRAOCULAR

## 2023-07-12 MED ORDER — TETRACAINE HCL 0.5 % OP SOLN
1.0000 [drp] | OPHTHALMIC | Status: AC
  Administered 2023-07-12 (×3): 1 [drp] via OPHTHALMIC

## 2023-07-12 MED ORDER — FENTANYL CITRATE (PF) 100 MCG/2ML IJ SOLN
INTRAMUSCULAR | Status: AC
  Filled 2023-07-12: qty 2

## 2023-07-12 MED ORDER — SIGHTPATH DOSE#1 SODIUM HYALURONATE 10 MG/ML IO SOLUTION
PREFILLED_SYRINGE | INTRAOCULAR | Status: DC | PRN
  Administered 2023-07-12: .85 mL via INTRAOCULAR

## 2023-07-12 MED ORDER — PHENYLEPHRINE HCL 2.5 % OP SOLN
1.0000 [drp] | OPHTHALMIC | Status: AC
  Administered 2023-07-12 (×3): 1 [drp] via OPHTHALMIC

## 2023-07-12 MED ORDER — STERILE WATER FOR IRRIGATION IR SOLN
Status: DC | PRN
  Administered 2023-07-12: 250 mL

## 2023-07-12 MED ORDER — LIDOCAINE HCL (PF) 1 % IJ SOLN
INTRAMUSCULAR | Status: DC | PRN
  Administered 2023-07-12: 1 mL

## 2023-07-12 MED ORDER — TROPICAMIDE 1 % OP SOLN
1.0000 [drp] | OPHTHALMIC | Status: AC
  Administered 2023-07-12 (×3): 1 [drp] via OPHTHALMIC

## 2023-07-12 MED ORDER — MIDAZOLAM HCL 2 MG/2ML IJ SOLN
INTRAMUSCULAR | Status: AC
  Filled 2023-07-12: qty 2

## 2023-07-12 MED ORDER — SODIUM CHLORIDE 0.9% FLUSH
INTRAVENOUS | Status: DC | PRN
  Administered 2023-07-12: 3 mL via INTRAVENOUS
  Administered 2023-07-12: 5 mL via INTRAVENOUS

## 2023-07-12 MED ORDER — SIGHTPATH DOSE#1 NA HYALUR & NA CHOND-NA HYALUR IO KIT
PACK | INTRAOCULAR | Status: DC | PRN
  Administered 2023-07-12: 1 via OPHTHALMIC

## 2023-07-12 MED ORDER — LIDOCAINE HCL 3.5 % OP GEL
1.0000 | Freq: Once | OPHTHALMIC | Status: AC
  Administered 2023-07-12: 1 via OPHTHALMIC

## 2023-07-12 MED ORDER — PHENYLEPHRINE-KETOROLAC 1-0.3 % IO SOLN
INTRAOCULAR | Status: DC | PRN
  Administered 2023-07-12: 500 mL via OPHTHALMIC

## 2023-07-12 MED ORDER — POVIDONE-IODINE 5 % OP SOLN
OPHTHALMIC | Status: DC | PRN
  Administered 2023-07-12: 1 via OPHTHALMIC

## 2023-07-12 MED ORDER — ACETAZOLAMIDE ER 500 MG PO CP12
500.0000 mg | ORAL_CAPSULE | Freq: Once | ORAL | Status: DC
Start: 2023-07-12 — End: 2023-07-12
  Filled 2023-07-12: qty 1

## 2023-07-12 SURGICAL SUPPLY — 18 items
CATARACT SUITE SIGHTPATH (MISCELLANEOUS) ×1 IMPLANT
CLOTH BEACON ORANGE TIMEOUT ST (SAFETY) ×1 IMPLANT
DRSG TEGADERM 4X4.75 (GAUZE/BANDAGES/DRESSINGS) ×1 IMPLANT
EYE SHIELD UNIVERSAL CLEAR (GAUZE/BANDAGES/DRESSINGS) IMPLANT
FEE CATARACT SUITE SIGHTPATH (MISCELLANEOUS) ×1 IMPLANT
GLOVE BIOGEL PI IND STRL 7.0 (GLOVE) ×2 IMPLANT
LENS IOL ACRSF MP 19.5 (Intraocular Lens) IMPLANT
LENS IOL ACRYSOF POST 19.5 (Intraocular Lens) ×1 IMPLANT
NDL HYPO 18GX1.5 BLUNT FILL (NEEDLE) ×1 IMPLANT
NEEDLE HYPO 18GX1.5 BLUNT FILL (NEEDLE) ×1 IMPLANT
PACK VITRECTOMY ANTERIOR (MISCELLANEOUS) IMPLANT
PAD ARMBOARD 7.5X6 YLW CONV (MISCELLANEOUS) ×1 IMPLANT
POSITIONER HEAD 8X9X4 ADT (SOFTGOODS) ×1 IMPLANT
RING MALYGIN 7.0 (MISCELLANEOUS) IMPLANT
SYR TB 1ML LL NO SAFETY (SYRINGE) ×1 IMPLANT
TAPE SURG TRANSPORE 1 IN (GAUZE/BANDAGES/DRESSINGS) IMPLANT
VISCOELASTIC ADDITIONAL (OPHTHALMIC RELATED) IMPLANT
WATER STERILE IRR 250ML POUR (IV SOLUTION) ×1 IMPLANT

## 2023-07-12 NOTE — Anesthesia Preprocedure Evaluation (Signed)
Anesthesia Evaluation  Patient identified by MRN, date of birth, ID band Patient awake    Reviewed: Allergy & Precautions, H&P , NPO status , Patient's Chart, lab work & pertinent test results, reviewed documented beta blocker date and time   Airway Mallampati: II  TM Distance: >3 FB Neck ROM: full    Dental no notable dental hx. (+) Dental Advisory Given, Teeth Intact   Pulmonary asthma    Pulmonary exam normal breath sounds clear to auscultation       Cardiovascular Exercise Tolerance: Good negative cardio ROS Normal cardiovascular exam Rhythm:regular Rate:Normal     Neuro/Psych negative neurological ROS  negative psych ROS   GI/Hepatic negative GI ROS, Neg liver ROS,,,  Endo/Other  negative endocrine ROSdiabetes    Renal/GU negative Renal ROS  negative genitourinary   Musculoskeletal   Abdominal Normal abdominal exam  (+)   Peds  Hematology negative hematology ROS (+)   Anesthesia Other Findings Psoriatic arthritis  Reproductive/Obstetrics negative OB ROS                              Anesthesia Physical Anesthesia Plan  ASA: 2  Anesthesia Plan: MAC   Post-op Pain Management: Minimal or no pain anticipated   Induction:   PONV Risk Score and Plan:   Airway Management Planned: Nasal Cannula and Natural Airway  Additional Equipment: None  Intra-op Plan:   Post-operative Plan:   Informed Consent: I have reviewed the patients History and Physical, chart, labs and discussed the procedure including the risks, benefits and alternatives for the proposed anesthesia with the patient or authorized representative who has indicated his/her understanding and acceptance.     Dental Advisory Given  Plan Discussed with: CRNA  Anesthesia Plan Comments:         Anesthesia Quick Evaluation

## 2023-07-12 NOTE — Discharge Instructions (Signed)
Please discharge patient when stable, will follow up today with Dr. Tod Abrahamsen at the [facility] [place] office immediately following discharge.  Leave shield in place until visit.  All paperwork with discharge instructions will be given at the office.  [facility] Address:  [address]  [place], [place] [address]  Dr. Feliciano Wynter's Phone: [phone]  

## 2023-07-12 NOTE — Interval H&P Note (Signed)
History and Physical Interval Note:  07/12/2023 9:06 AM  The H and P was reviewed and updated. The patient was examined.  No changes were found after exam.  The surgical eye was marked.  Brandon Pennington

## 2023-07-12 NOTE — Transfer of Care (Addendum)
Immediate Anesthesia Transfer of Care Note  Patient: Brandon Pennington  Procedure(s) Performed: CATARACT EXTRACTION PHACO AND INTRAOCULAR LENS PLACEMENT (IOC) (Right: Eye) ANTERIOR VITRECTOMY (Right: Eye)  Patient Location: Short Stay  Anesthesia Type:MAC  Level of Consciousness: awake and patient cooperative  Airway & Oxygen Therapy: Patient Spontanous Breathing  Post-op Assessment: Report given to RN and Post -op Vital signs reviewed and stable  Post vital signs: Reviewed and stable  Last Vitals:  Vitals Value Taken Time  BP 138/75 07/12/23   1050  Temp 37.1 07/12/23   1050  Pulse 74 07/12/23   1050  Resp 14 07/12/23   1050  SpO2 97% 07/12/23   1050    Last Pain:  Vitals:   07/12/23 0834  TempSrc: Oral  PainSc: 0-No pain         Complications: No notable events documented.

## 2023-07-12 NOTE — Op Note (Signed)
Date of procedure: 07/12/23  Pre-operative diagnosis: Visually significant age-related nuclear cataract, Right Eye (H25.11)  Post-operative diagnosis: Visually significant age-related nuclear cataract, Right Eye  Procedure: Removal of cataract via phacoemulsification and insertion of intra-ocular lens Alcon [id number] +19.5D into the capsular bag of the Right Eye. Anterior Vitrectomy, Right Eye, Complex [phone]  Attending surgeon: Pecolia Ades, MD  Anesthesia: MAC, Topical Akten  Complications: Anterior Vitrectomy Performed  Estimated Blood Loss: <40mL (minimal)  Specimens: None  Implants:  Implant Name Type Inv. Item Serial No. Manufacturer Lot No. LRB No. Used Action  acrysof IOL Intraocular Lens  [id number] ALCON  Right 1 Implanted    Indications:  Visually significant age-related cataract, Right Eye  Procedure:  The patient was seen and identified in the pre-operative area. The operative eye was identified and dilated.  The operative eye was marked.  Topical anesthesia was administered to the operative eye.     The patient was then to the operative suite and placed in the supine position.  A timeout was performed confirming the patient, procedure to be performed, and all other relevant information.   The patient's face was prepped and draped in the usual fashion for intra-ocular surgery.  A lid speculum was placed into the operative eye and the surgical microscope moved into place and focused.  A superotemporal paracentesis was created using a 20 gauge paracentesis blade.  BSS mixed with Omidria, followed by 1% lidocaine was injected into the anterior chamber.  Viscoelastic was injected into the anterior chamber.  A temporal clear-corneal main wound incision was created using a 2.88mm microkeratome.  A continuous curvilinear capsulorrhexis was initiated using an irrigating cystitome and completed using capsulorrhexis forceps.  Hydrodissection and hydrodeliniation were performed.   Viscoelastic was injected into the anterior chamber.  A phacoemulsification handpiece and a chopper as a second instrument were used to remove the nucleus and epinucleus. After removal of the nucleus, there was a posterior capsular rent noted nasally. Viscoelastic was injected into the eye and the anterior vitrector was set up. A second paracentesis incision was made inferior temporally and a third was created nasally. No vitreous was appreciated at the wounds. It was suspected that the vitreous face had not been disturbed yet. The anterior vitrector was used to remove the remaining cortical material. Given the likelihood of vitreous face disruption with lens insertion, an anterior vitrectomy was then performed.   The anterior chamber was reinflated with viscoelastic. The intraocular lens was inserted into the sulcus space and the optic was captured within the anterior capsulorrhexis.  The anterior vitrectomy unit was used to remove any remaining viscoelastic.  The clear corneal wound and paracenteses wounds were then hydrated and checked with Weck-Cels to be watertight. Moxifloxacin was instilled into the anterior chamber.  The lid-speculum and drape was removed, and the patient's face was cleaned with a wet and dry 4x4. A clear shield was taped over the eye. The patient was taken to the post-operative care unit in good condition, having tolerated the procedure well.  Post-Op Instructions: The patient will follow up at [facility] for a same day post-operative evaluation and will receive all other orders and instructions.

## 2023-07-12 NOTE — Anesthesia Postprocedure Evaluation (Signed)
Anesthesia Post Note  Patient: Brandon Pennington  Procedure(s) Performed: CATARACT EXTRACTION PHACO AND INTRAOCULAR LENS PLACEMENT (IOC) (Right: Eye) ANTERIOR VITRECTOMY (Right: Eye)  Patient location during evaluation: Phase II Anesthesia Type: MAC Level of consciousness: awake Pain management: pain level controlled Vital Signs Assessment: post-procedure vital signs reviewed and stable Respiratory status: spontaneous breathing and respiratory function stable Cardiovascular status: blood pressure returned to baseline and stable Postop Assessment: no headache and no apparent nausea or vomiting Anesthetic complications: no Comments: Late entry   No notable events documented.   Last Vitals:  Vitals:   07/12/23 0834 07/12/23 1050  BP: (!) 138/91 138/75  Pulse: 76   Resp: 14 14  Temp: 37.1 C 37.1 C  SpO2: 97% 97%    Last Pain:  Vitals:   07/12/23 1050  TempSrc: Oral  PainSc: 0-No pain                 [name]

## 2023-07-17 ENCOUNTER — Encounter (HOSPITAL_COMMUNITY): Payer: Self-pay | Admitting: Optometry

## 2024-02-13 ENCOUNTER — Other Ambulatory Visit (HOSPITAL_COMMUNITY): Payer: Self-pay | Admitting: Internal Medicine

## 2024-02-13 DIAGNOSIS — R748 Abnormal levels of other serum enzymes: Secondary | ICD-10-CM

## 2024-02-20 ENCOUNTER — Ambulatory Visit (HOSPITAL_COMMUNITY)
Admission: RE | Admit: 2024-02-20 | Discharge: 2024-02-20 | Disposition: A | Discharge status: Home / Self Care | Source: Ambulatory Visit | Attending: Internal Medicine | Admitting: Internal Medicine

## 2024-02-20 DIAGNOSIS — R748 Abnormal levels of other serum enzymes: Secondary | ICD-10-CM | POA: Diagnosis present

## 2024-07-10 ENCOUNTER — Ambulatory Visit: Admitting: Family Medicine

## 2024-07-10 ENCOUNTER — Telehealth: Payer: Self-pay

## 2024-07-10 NOTE — Telephone Encounter (Signed)
 Communication  Pt is requesting to transfer FROM: Bertell Satterfield, MD    Pt is requesting to transfer TO: Philip McGowen    Reason for requested transfer: Previous PCP retired    It is the responsibility of the team the patient would like to transfer to (Dr. McGowen) to reach out to the patient if for any reason this transfer is not acceptable.   FYI.

## 2024-07-13 NOTE — Telephone Encounter (Signed)
 Pt is scheduled for 09/11/24.

## 2024-07-23 ENCOUNTER — Encounter: Payer: Self-pay | Admitting: Family Medicine

## 2024-07-23 ENCOUNTER — Ambulatory Visit: Admitting: Family Medicine

## 2024-07-23 VITALS — BP 130/64 | HR 77 | Temp 98.1°F | Ht 67.25 in | Wt 196.4 lb

## 2024-07-23 DIAGNOSIS — Z125 Encounter for screening for malignant neoplasm of prostate: Secondary | ICD-10-CM

## 2024-07-23 DIAGNOSIS — E349 Endocrine disorder, unspecified: Secondary | ICD-10-CM

## 2024-07-23 DIAGNOSIS — E781 Pure hyperglyceridemia: Secondary | ICD-10-CM

## 2024-07-23 DIAGNOSIS — E119 Type 2 diabetes mellitus without complications: Secondary | ICD-10-CM

## 2024-07-23 DIAGNOSIS — I10 Essential (primary) hypertension: Secondary | ICD-10-CM

## 2024-07-23 DIAGNOSIS — M064 Inflammatory polyarthropathy: Secondary | ICD-10-CM | POA: Insufficient documentation

## 2024-07-23 DIAGNOSIS — Z23 Encounter for immunization: Secondary | ICD-10-CM

## 2024-07-23 MED ORDER — HYDROCHLOROTHIAZIDE 12.5 MG PO TABS: 12.5000 mg | ORAL_TABLET | Freq: Every day | ORAL | 0 refills | Status: DC

## 2024-07-23 MED ORDER — FLUOXETINE HCL 40 MG PO CAPS: 40.0000 mg | ORAL_CAPSULE | Freq: Every day | ORAL | 3 refills | Status: AC

## 2024-07-23 MED ORDER — GLIMEPIRIDE 4 MG PO TABS: 4.0000 mg | ORAL_TABLET | Freq: Every day | ORAL | 3 refills | Status: AC

## 2024-07-23 MED ORDER — BUPROPION HCL ER (XL) 150 MG PO TB24: 150.0000 mg | ORAL_TABLET | Freq: Every day | ORAL | 0 refills | Status: DC

## 2024-07-23 NOTE — Progress Notes (Signed)
 Office Note 07/23/2024  CC:  Chief Complaint  Patient presents with   Establish Care   Medical Management of Chronic Issues    HPI:  Brandon Pennington is a 63 y.o. White male who is here accompanied by his wife Reena to establish care, follow-up multiple chronic medical problems Patient's most recent primary MD: Dr. Bertell Chester Old records in epic[facility] EMR were reviewed prior to or during today's visit.  Still struggling with chronic generalized anxiety.  No panic attacks.  Mood a bit more withdrawn than usual. At some point in the past he was switched from Prozac  to Lexapro but this caused excessive irritability.  His wife put him back on the Prozac  40 mg capsule daily and the irritability has decreased to his baseline. He does not drink alcohol or smoke cigarettes.  He says he has been on testosterone  200 mg every 7 days long-term. Last injection was about 6 days ago.  Home blood pressures in the 140s to 150s systolic.  Home glucoses around 180s in the morning and somewhat lower in the late afternoon, sometimes down to the 70s. He eats a carb rich diet at supper and eats high carb snack foods afterwards.  ROS as above, plus--> no fevers, no CP, no SOB, no wheezing, no cough, no dizziness, no HAs, no rashes, no melena/hematochezia.  No polyuria or polydipsia.  No myalgias or arthralgias.  No focal weakness, paresthesias, or tremors.  No acute vision or hearing abnormalities.  No dysuria or unusual/new urinary urgency or frequency.  No recent changes in lower legs. No n/v/d or abd pain.  No palpitations.    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    Mild persistent asthma    Obesity    Psoriatic arthritis (HCC)    Rheumatoid arthritis (HCC)    Seasonal allergies     Past Surgical History:  Procedure Laterality Date    ANTERIOR VITRECTOMY Right 07/12/2023   Procedure: ANTERIOR VITRECTOMY;  Surgeon: Juli Blunt, MD;  Location: [facility];  Service: Ophthalmology;  Laterality: Right;   CATARACT EXTRACTION W/PHACO Left 06/14/2023   Procedure: CATARACT EXTRACTION PHACO AND INTRAOCULAR LENS PLACEMENT (IOC);  Surgeon: Juli Blunt, MD;  Location: [facility];  Service: Ophthalmology;  Laterality: Left;  CDE:5.90   CATARACT EXTRACTION W/PHACO Right 07/12/2023   Procedure: CATARACT EXTRACTION PHACO AND INTRAOCULAR LENS PLACEMENT (IOC);  Surgeon: Juli Blunt, MD;  Location: [facility];  Service: Ophthalmology;  Laterality: Right;  CDE: 3.83   COLONOSCOPY N/A 02/23/2014   no polyps-->recall 10 yrs. Procedure: COLONOSCOPY;  Surgeon: Oneil DELENA Budge, MD;  Location: [facility];  Service: Gastroenterology;  Laterality: N/A;   VASECTOMY     WISDOM TOOTH EXTRACTION      Family History  Problem Relation Age of Onset   Melanoma Father    Cancer Father    Alcohol abuse Brother    Depression Brother    Alcohol abuse Daughter    Depression Daughter    Drug abuse Daughter    Colon cancer Neg Hx     Social History   Socioeconomic History   Marital status: Married    Spouse name: Not on file   Number of children: Not on file   Years of education: Not on file   Highest education level: Not on file  Occupational History  Not on file  Tobacco Use   Smoking status: Never   Smokeless tobacco: Not on file  Substance and Sexual Activity   Alcohol use: No   Drug use: No   Sexual activity: Not on file  Other Topics Concern   Not on file  Social History Narrative   Not on file   Social Drivers of Health   Financial Resource Strain: Not on file  Food Insecurity: Not on file  Transportation Needs: Not on file  Physical Activity: Not on file  Stress: Not on file  Social Connections: Not on file  Intimate Partner Violence: Not on file    Outpatient Encounter Medications as of 07/23/2024  Medication Sig    Adalimumab (HUMIRA) 40 MG/0.8ML PSKT Inject 40 mg into the skin every 14 (fourteen) days.   amLODipine (NORVASC) 10 MG tablet Take 10 mg by mouth daily.   buPROPion (WELLBUTRIN XL) 150 MG 24 hr tablet Take 1 tablet (150 mg total) by mouth daily.   Empagliflozin (JARDIANCE PO) Take by mouth.   fenofibrate 160 MG tablet Take 160 mg by mouth daily.   FLUoxetine (PROZAC) 40 MG capsule Take 1 capsule (40 mg total) by mouth daily.   Fluticasone-Salmeterol (ADVAIR) 250-50 MCG/DOSE AEPB Inhale 1 puff into the lungs 2 (two) times daily.   folic acid (FOLVITE) 1 MG tablet Take 1 mg by mouth daily.   glimepiride (AMARYL) 4 MG tablet Take 1 tablet (4 mg total) by mouth daily before breakfast.   hydrochlorothiazide (HYDRODIURIL) 12.5 MG tablet Take 1 tablet (12.5 mg total) by mouth daily.   montelukast (SINGULAIR) 10 MG tablet Take 10 mg by mouth daily.   Multiple Vitamin (MULTIVITAMIN WITH MINERALS) TABS tablet Take 1 tablet by mouth daily.   olmesartan (BENICAR) 40 MG tablet Take 40 mg by mouth daily.   testosterone  cypionate (DEPOTESTOTERONE CYPIONATE) 200 MG/ML injection Inject 200 mg into the muscle every 7 (seven) days.   VITAMIN D PO Vitamin D   [DISCONTINUED] Flaxseed, Linseed, (FLAX SEED OIL PO) Flax Seed Oil   [DISCONTINUED] methotrexate (50 MG/ML) 1 G injection Inject 25 mg into the vein once a week.   No facility-administered encounter medications on file as of 07/23/2024.    No Known Allergies  PE; Blood pressure 130/64, pulse 77, temperature 98.1 F (36.7 C), temperature source Oral, height 5' 7.25 (1.708 m), weight 196 lb 6.4 oz (89.1 kg), SpO2 96%. Body mass index is 30.53 kg/m.  Physical Exam  Gen: Alert, well appearing.  Patient is oriented to person, place, time, and situation. AFFECT: pleasant, lucid thought and speech. CV: RRR, no m/r/g.   LUNGS: CTA bilat, nonlabored resps, good aeration in all lung fields. EXT: no clubbing or cyanosis.  no edema.   Pertinent labs:   Last CBC Lab Results  Component Value Date   WBC 8.7 12/05/2014   HGB 14.7 12/05/2014   HCT 42.5 12/05/2014   MCV 91.4 12/05/2014   MCH 31.6 12/05/2014   RDW 13.9 12/05/2014   PLT 277 12/05/2014   Last metabolic panel Lab Results  Component Value Date   GLUCOSE 178 (H) 12/04/2014   NA 137 12/04/2014   K 3.9 12/04/2014   CL 103 12/04/2014   CO2 26 12/04/2014   BUN 17 12/04/2014   CREATININE 1.17 12/04/2014   GFRNONAA 70 (L) 12/04/2014   CALCIUM 8.8 12/04/2014   ANIONGAP 8 12/04/2014    ASSESSMENT AND PLAN:   New patient, establishing care.  1.  GAD, recurrent major depressive  disorder. Not ideal control.  Add Wellbutrin  XL 150 mg a day.  Continue Prozac  40 mg a day.  2.  Uncontrolled hypertension. Add HCTZ 12.5 mg today. Continue amlodipine 10 mg a day and telmisartan 40 mg a day. Electrolytes and creatinine monitoring today.  3.  Male hypogonadism. He has been on testosterone  replacement long-term---> 200 mg IM q. 7-days, most recent ejection 6 days ago. Monitor testosterone  level, hemoglobin, and PSA today.  4.  Diabetes without complication.  Poor control. Discussed importance of low-carb diet, particularly in afternoon and evenings. May need to add GLP-1 agonist or pioglitazone.  Will await hemoglobin A1c before deciding. Continue Amaryl  4 mg a day and Jardiance 25 mg a day. Will do feet exam and check urine microalbumin/creatinine at next follow-up.  5.  Preventative health care:  Prostate ca screening:  PSA ordered today. Colon ca screening: normal TCS 2015, recall at any time now. Vaccines: Not discussed today.  An After Visit Summary was printed and given to the patient.  Return in about 2 weeks (around 08/06/2024) for f/u HTN.  Signed:  Gerlene Hockey, MD           07/23/2024

## 2024-07-23 NOTE — Patient Instructions (Signed)
 SABRA

## 2024-07-24 LAB — CBC WITH DIFFERENTIAL/PLATELET
Absolute Lymphocytes: 2843 {cells}/uL (ref 850–3900)
Absolute Monocytes: 1082 {cells}/uL — ABNORMAL HIGH (ref 200–950)
Basophils Absolute: 144 {cells}/uL (ref 0–200)
Basophils Relative: 1.4 %
Eosinophils Absolute: 525 {cells}/uL — ABNORMAL HIGH (ref 15–500)
Eosinophils Relative: 5.1 %
HCT: 55.2 % — ABNORMAL HIGH (ref 39.4–51.1)
Hemoglobin: 18.3 g/dL — ABNORMAL HIGH (ref 13.2–17.1)
MCH: 27.5 pg (ref 27.0–33.0)
MCHC: 33.2 g/dL (ref 31.6–35.4)
MCV: 83 fL (ref 81.4–101.7)
MPV: 9.7 fL (ref 7.5–12.5)
Monocytes Relative: 10.5 %
Neutro Abs: 5706 {cells}/uL (ref 1500–7800)
Neutrophils Relative %: 55.4 %
Platelets: 371 Thousand/uL (ref 140–400)
RBC: 6.65 Million/uL — ABNORMAL HIGH (ref 4.20–5.80)
RDW: 17.6 % — ABNORMAL HIGH (ref 11.0–15.0)
Total Lymphocyte: 27.6 %
WBC: 10.3 Thousand/uL (ref 3.8–10.8)

## 2024-07-24 LAB — COMPREHENSIVE METABOLIC PANEL WITH GFR
AG Ratio: 1.7 (calc) (ref 1.0–2.5)
ALT: 29 U/L (ref 9–46)
AST: 19 U/L (ref 10–35)
Albumin: 5 g/dL (ref 3.6–5.1)
Alkaline phosphatase (APISO): 52 U/L (ref 35–144)
BUN: 16 mg/dL (ref 7–25)
CO2: 28 mmol/L (ref 20–32)
Calcium: 10.3 mg/dL (ref 8.6–10.3)
Chloride: 99 mmol/L (ref 98–110)
Creat: 0.86 mg/dL (ref 0.70–1.35)
Globulin: 3 g/dL (ref 1.9–3.7)
Glucose, Bld: 105 mg/dL — ABNORMAL HIGH (ref 65–99)
Potassium: 4.3 mmol/L (ref 3.5–5.3)
Sodium: 137 mmol/L (ref 135–146)
Total Bilirubin: 0.5 mg/dL (ref 0.2–1.2)
Total Protein: 8 g/dL (ref 6.1–8.1)
eGFR: 99 mL/min/1.73m2 (ref >=60)

## 2024-07-24 LAB — LIPID PANEL
Cholesterol: 208 mg/dL — ABNORMAL HIGH (ref <200)
HDL: 44 mg/dL (ref >=40)
LDL Cholesterol (Calc): 129 mg/dL — ABNORMAL HIGH
Non-HDL Cholesterol (Calc): 164 mg/dL — ABNORMAL HIGH (ref <130)
Total CHOL/HDL Ratio: 4.7 (calc) (ref <5.0)
Triglycerides: 201 mg/dL — ABNORMAL HIGH (ref <150)

## 2024-07-24 LAB — HEMOGLOBIN A1C
Hgb A1c MFr Bld: 8.5 % — ABNORMAL HIGH (ref <5.7)
Mean Plasma Glucose: 197 mg/dL
eAG (mmol/L): 10.9 mmol/L

## 2024-07-24 LAB — PSA: PSA: 1.03 ng/mL (ref <=4.00)

## 2024-07-24 LAB — TESTOSTERONE: Testosterone: 1428 ng/dL — ABNORMAL HIGH (ref 250–827)

## 2024-07-28 ENCOUNTER — Ambulatory Visit: Payer: Self-pay | Admitting: Family Medicine

## 2024-07-28 MED ORDER — TIRZEPATIDE 2.5 MG/0.5ML ~~LOC~~ SOAJ: 2.5000 mg | SUBCUTANEOUS | 0 refills | Status: AC

## 2024-08-05 ENCOUNTER — Other Ambulatory Visit: Payer: Self-pay | Admitting: Family Medicine

## 2024-08-05 DIAGNOSIS — E349 Endocrine disorder, unspecified: Secondary | ICD-10-CM

## 2024-08-05 DIAGNOSIS — E291 Testicular hypofunction: Secondary | ICD-10-CM

## 2024-08-06 NOTE — Telephone Encounter (Signed)
 Requesting: testosterone  Contract: N/A UDS: labs 07/23/24 Last Visit: 07/23/24 Next Visit: 08/10/24 Last Refill: historical provider, 2015  Please Advise. Rx pending, based on recent labs (07/23/24) he should decrease to 0.5mg  q7d. Please fill, if appropriate.

## 2024-08-07 NOTE — Addendum Note (Signed)
 Addended by: FLETA CARE D on: 08/07/2024 11:01 AM   Modules accepted: Orders

## 2024-08-10 ENCOUNTER — Ambulatory Visit: Admitting: Family Medicine

## 2024-08-10 ENCOUNTER — Encounter: Payer: Self-pay | Admitting: Family Medicine

## 2024-08-10 VITALS — BP 128/58 | HR 78 | Temp 97.6°F | Ht 67.25 in | Wt 191.8 lb

## 2024-08-10 DIAGNOSIS — E7800 Pure hypercholesterolemia, unspecified: Secondary | ICD-10-CM | POA: Diagnosis not present

## 2024-08-10 DIAGNOSIS — Z7985 Long-term (current) use of injectable non-insulin antidiabetic drugs: Secondary | ICD-10-CM | POA: Diagnosis not present

## 2024-08-10 DIAGNOSIS — I10 Essential (primary) hypertension: Secondary | ICD-10-CM

## 2024-08-10 DIAGNOSIS — E349 Endocrine disorder, unspecified: Secondary | ICD-10-CM | POA: Diagnosis not present

## 2024-08-10 DIAGNOSIS — F411 Generalized anxiety disorder: Secondary | ICD-10-CM

## 2024-08-10 DIAGNOSIS — Z23 Encounter for immunization: Secondary | ICD-10-CM

## 2024-08-10 DIAGNOSIS — Z7984 Long term (current) use of oral hypoglycemic drugs: Secondary | ICD-10-CM

## 2024-08-10 DIAGNOSIS — E119 Type 2 diabetes mellitus without complications: Secondary | ICD-10-CM | POA: Diagnosis not present

## 2024-08-10 MED ORDER — ATORVASTATIN CALCIUM 20 MG PO TABS: 20.0000 mg | ORAL_TABLET | Freq: Every day | ORAL | 3 refills | Status: AC

## 2024-08-10 MED ORDER — TIRZEPATIDE 7.5 MG/0.5ML ~~LOC~~ SOAJ: 7.5000 mg | SUBCUTANEOUS | 1 refills | Status: AC

## 2024-08-10 MED ORDER — ALBUTEROL SULFATE HFA 108 (90 BASE) MCG/ACT IN AERS: 2.0000 | INHALATION_SPRAY | Freq: Four times a day (QID) | RESPIRATORY_TRACT | 0 refills | Status: DC | PRN

## 2024-08-10 MED ORDER — BUPROPION HCL ER (XL) 150 MG PO TB24: 150.0000 mg | ORAL_TABLET | Freq: Every day | ORAL | 3 refills | Status: AC

## 2024-08-10 MED ORDER — HYDROCHLOROTHIAZIDE 25 MG PO TABS: 25.0000 mg | ORAL_TABLET | Freq: Every day | ORAL | 3 refills | Status: AC

## 2024-08-10 MED ORDER — TIRZEPATIDE 5 MG/0.5ML ~~LOC~~ SOAJ: 5.0000 mg | SUBCUTANEOUS | 0 refills | Status: AC

## 2024-08-10 NOTE — Progress Notes (Signed)
 OFFICE VISIT  08/10/2024  CC:  Chief Complaint  Patient presents with   Medical Management of Chronic Issues    Patient is a 63 y.o. male who presents for 2-week follow-up anxiety and depression, hypotestosteronism, diabetes, and uncontrolled hypertension. A/P as of last visit: GAD, recurrent major depressive disorder. Not ideal control.  Add Wellbutrin  XL 150 mg a day.  Continue Prozac  40 mg a day.   2.  Uncontrolled hypertension. Add HCTZ 12.5 mg today. Continue amlodipine 10 mg a day and telmisartan 40 mg a day. Electrolytes and creatinine monitoring today.   3.  Male hypogonadism. He has been on testosterone  replacement long-term---> 200 mg IM q. 7-days, most recent ejection 6 days ago. Monitor testosterone  level, hemoglobin, and PSA today.   4.  Diabetes without complication.  Poor control. Discussed importance of low-carb diet, particularly in afternoon and evenings. May need to add GLP-1 agonist or pioglitazone.  Will await hemoglobin A1c before deciding. Continue Amaryl  4 mg a day and Jardiance 25 mg a day. Will do feet exam and check urine microalbumin/creatinine at next follow-up.   5.  Preventative health care:  Prostate ca screening:  PSA ordered today. Colon ca screening: normal TCS 2015, recall at any time now. Vaccines: Not discussed today.  INTERIM HX: At his last visit his testosterone  was elevated.  I decreased his dose to 100 mg every 7 days and recommended he donate blood (hemoglobin was 18.3). Hemoglobin A1c was 8.5% and I recommended he start Mounjaro . Lipids significantly elevated but we will wait to address this.  Doing well, mood improved, less irritable.  He just started his Mounjaro  2.5 mg a couple of weeks ago.  No side effects. He has no burning, tingling, or numbness in feet.  He measured his blood pressure once at home since last visit and he recalls a systolic around 140.  ROS as above, plus--> no fevers, no CP, no SOB, no wheezing, no  cough, no dizziness, no HAs, no rashes, no melena/hematochezia.  No polyuria or polydipsia.  No myalgias or arthralgias.  No focal weakness, paresthesias, or tremors.  No acute vision or hearing abnormalities.  No dysuria or unusual/new urinary urgency or frequency.  No recent changes in lower legs. No n/v/d or abd pain.  No palpitations.     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    Mild persistent asthma    Obesity    Psoriatic arthritis (HCC)    Rheumatoid arthritis (HCC)    Seasonal allergies     Past Surgical History:  Procedure Laterality Date   ANTERIOR VITRECTOMY Right 07/12/2023   Procedure: ANTERIOR VITRECTOMY;  Surgeon: Juli Blunt, MD;  Location: [facility];  Service: Ophthalmology;  Laterality: Right;   CATARACT EXTRACTION W/PHACO Left 06/14/2023   Procedure: CATARACT EXTRACTION PHACO AND INTRAOCULAR LENS PLACEMENT (IOC);  Surgeon: Juli Blunt, MD;  Location: [facility];  Service: Ophthalmology;  Laterality: Left;  CDE:5.90   CATARACT EXTRACTION W/PHACO Right 07/12/2023   Procedure: CATARACT EXTRACTION PHACO AND INTRAOCULAR LENS PLACEMENT (IOC);  Surgeon: Juli Blunt, MD;  Location: [facility];  Service: Ophthalmology;  Laterality: Right;  CDE: 3.83   COLONOSCOPY N/A 02/23/2014   no polyps-->recall 10 yrs. Procedure: COLONOSCOPY;  Surgeon: Oneil DELENA Budge, MD;  Location: [facility];  Service: Gastroenterology;  Laterality: N/A;   VASECTOMY  WISDOM TOOTH EXTRACTION      Outpatient Medications Prior to Visit  Medication Sig Dispense Refill   Adalimumab (HUMIRA) 40 MG/0.8ML PSKT Inject 40 mg into the skin every 14 (fourteen) days.     amLODipine (NORVASC) 10 MG tablet Take 10 mg by mouth daily.     Empagliflozin (JARDIANCE PO) Take by mouth.     fenofibrate 160 MG tablet Take 160 mg by mouth daily.     FLUoxetine   (PROZAC ) 40 MG capsule Take 1 capsule (40 mg total) by mouth daily. 90 capsule 3   Fluticasone-Salmeterol (ADVAIR) 250-50 MCG/DOSE AEPB Inhale 1 puff into the lungs 2 (two) times daily.     folic acid (FOLVITE) 1 MG tablet Take 1 mg by mouth daily.     glimepiride  (AMARYL ) 4 MG tablet Take 1 tablet (4 mg total) by mouth daily before breakfast. 90 tablet 3   montelukast (SINGULAIR) 10 MG tablet Take 10 mg by mouth daily.     Multiple Vitamin (MULTIVITAMIN WITH MINERALS) TABS tablet Take 1 tablet by mouth daily.     olmesartan (BENICAR) 40 MG tablet Take 40 mg by mouth daily.     testosterone  cypionate (DEPOTESTOSTERONE CYPIONATE) 200 MG/ML injection Inject 0.5 mLs (100 mg total) into the muscle once a week. 10 mL 0   tirzepatide  (MOUNJARO ) 2.5 MG/0.5ML Pen Inject 2.5 mg into the skin once a week. 2 mL 0   VITAMIN D PO Vitamin D     buPROPion  (WELLBUTRIN  XL) 150 MG 24 hr tablet Take 1 tablet (150 mg total) by mouth daily. 30 tablet 0   hydrochlorothiazide  (HYDRODIURIL ) 12.5 MG tablet Take 1 tablet (12.5 mg total) by mouth daily. 30 tablet 0   No facility-administered medications prior to visit.    Allergies[1]  Review of Systems As per HPI  PE:    08/10/2024    2:35 PM 08/10/2024    2:25 PM 07/23/2024    3:55 PM  Vitals with BMI  Height  5' 7.25   Weight  191 lbs 13 oz   BMI  29.82   Systolic 128 142 869  Diastolic 58 60 64  Pulse  78    Initial bp today 142/60  Physical Exam  Gen: Alert, well appearing.  Patient is oriented to person, place, time, and situation. AFFECT: pleasant, lucid thought and speech. Foot exam - no swelling, tenderness or skin or vascular lesions. Color and temperature is normal. Sensation is intact. Peripheral pulses are palpable. Toenails are normal.   LABS:  Last CBC Lab Results  Component Value Date   WBC 10.3 07/23/2024   HGB 18.3 (H) 07/23/2024   HCT 55.2 (H) 07/23/2024   MCV 83.0 07/23/2024   MCH 27.5 07/23/2024   RDW 17.6 (H)  07/23/2024   PLT 371 07/23/2024   Lab Results  Component Value Date   TESTOSTERONE  1,428 (H) 07/23/2024   Last metabolic panel Lab Results  Component Value Date   GLUCOSE 105 (H) 07/23/2024   NA 137 07/23/2024   K 4.3 07/23/2024   CL 99 07/23/2024   CO2 28 07/23/2024   BUN 16 07/23/2024   CREATININE 0.86 07/23/2024   EGFR 99 07/23/2024   CALCIUM  10.3 07/23/2024   PROT 8.0 07/23/2024   BILITOT 0.5 07/23/2024   AST 19 07/23/2024   ALT 29 07/23/2024   ANIONGAP 8 12/04/2014   Last lipids Lab Results  Component Value Date   CHOL 208 (H) 07/23/2024   HDL 44 07/23/2024   LDLCALC 129 (  H) 07/23/2024   TRIG 201 (H) 07/23/2024   CHOLHDL 4.7 07/23/2024   Last hemoglobin A1c Lab Results  Component Value Date   HGBA1C 8.5 (H) 07/23/2024   Lab Results  Component Value Date   PSA 1.03 07/23/2024   IMPRESSION AND PLAN:  1.GAD, recurrent major depressive disorder. Improved with recent start of Wellbutrin  XL 150 mg a day.  Continue current dose. Also continue Prozac  40 mg a day.   2.  Uncontrolled hypertension. Increase HCTZ to 25 mg a day. Continue amlodipine 10 mg a day and telmisartan 40 mg a day.   3.  Male hypogonadism. He has been on testosterone  replacement long-term---> testosterone  was elevated on the 200 mg IM q. 7-day dosing when I saw him last. I decreased his dose to 100 mg IM q. 7 days.  He got this message a little bit late and so he just now made this dosing change. Hemoglobin 18.3 last visit.  I encouraged him to donate blood.  We will recheck this in 3 months.  4.  Diabetes without complication.  Poor control. Tolerating recent start of Mounjaro  2.5 mg weekly.  Double the dose each month--> prescriptions sent today. Continue Amaryl  4 mg a day and Jardiance 25 mg a day. Feet exam normal today. Urine microalbumin/creatinine ordered today. Next A1c after 10/21/2024.  5.  Hypercholesterolemia. LDL 129.  Goal less than 70. Start a atorvastatin  20 mg a  day.  6.  Preventative health care:  PSA UTD 07/23/2024. Colon ca screening: normal TCS 2015, recall at any time now. Vaccines: Prevnar 20 given today.  Offer Tdap at next visit and he will consider Shingrix at a future visit as well.  An After Visit Summary was printed and given to the patient.  FOLLOW UP: Return in about 3 months (around 11/08/2024) for routine chronic illness f/u.  Signed:  Gerlene Hockey, MD           08/10/2024       [1] No Known Allergies

## 2024-08-11 ENCOUNTER — Ambulatory Visit: Payer: Self-pay | Admitting: Family Medicine

## 2024-08-11 LAB — MICROALBUMIN / CREATININE URINE RATIO
Creatinine,U: 39.8 mg/dL
Microalb Creat Ratio: 19.8 mg/g (ref 0.0–30.0)
Microalb, Ur: 0.8 mg/dL (ref 0.7–1.9)

## 2024-09-02 ENCOUNTER — Other Ambulatory Visit: Payer: Self-pay

## 2024-09-02 DIAGNOSIS — E119 Type 2 diabetes mellitus without complications: Secondary | ICD-10-CM

## 2024-09-02 MED ORDER — ONETOUCH DELICA PLUS LANCET33G MISC: 3 refills | Status: AC

## 2024-09-04 ENCOUNTER — Other Ambulatory Visit: Payer: Self-pay | Admitting: Family Medicine

## 2024-09-04 MED ORDER — FENOFIBRATE 160 MG PO TABS: 160.0000 mg | ORAL_TABLET | Freq: Every day | ORAL | 3 refills | Status: AC

## 2024-09-04 MED ORDER — FOLIC ACID 1 MG PO TABS: 1.0000 mg | ORAL_TABLET | Freq: Every day | ORAL | 3 refills | Status: AC

## 2024-09-04 MED ORDER — ALBUTEROL SULFATE HFA 108 (90 BASE) MCG/ACT IN AERS: 2.0000 | INHALATION_SPRAY | Freq: Four times a day (QID) | RESPIRATORY_TRACT | 1 refills | Status: AC | PRN

## 2024-09-04 NOTE — Addendum Note (Signed)
 Addended by: GEORGEAN BEEN A on: 09/04/2024 02:51 PM   Modules accepted: Orders

## 2024-09-04 NOTE — Telephone Encounter (Signed)
 Okay to remain off of Norvasc .  Please take this medication off of his medication list. I do want him to be on fenofibrate .  Please do prescription for 160 mg tab, #90, refill x 3. Also refill folic acid  1 mg daily, #90, refill x 3. Also refill albuterol , #1 inhaler, refill x 1.

## 2024-09-04 NOTE — Telephone Encounter (Signed)
 Glucose meter supplies sent to pharmacy, please confirm if pt is still supposed to be on Norvasc . Currently not on med list.

## 2024-09-08 ENCOUNTER — Other Ambulatory Visit: Payer: Self-pay

## 2024-09-08 DIAGNOSIS — E119 Type 2 diabetes mellitus without complications: Secondary | ICD-10-CM

## 2024-09-08 MED ORDER — AMLODIPINE BESYLATE 10 MG PO TABS: 10.0000 mg | ORAL_TABLET | Freq: Every day | ORAL | 1 refills | Status: AC

## 2024-09-11 ENCOUNTER — Ambulatory Visit: Admitting: Family Medicine

## 2024-09-14 MED ORDER — ONETOUCH VERIO VI STRP: ORAL_STRIP | 12 refills | Status: AC

## 2024-11-09 ENCOUNTER — Ambulatory Visit: Admitting: Family Medicine
